# Patient Record
Sex: Male | Born: 1935 | Race: White | Hispanic: No | Marital: Married | Smoking: Former smoker
Health system: Southern US, Community
[De-identification: ages and names within clinical notes are randomized; demographics above are authoritative.]

## PROBLEM LIST (undated history)

## (undated) DIAGNOSIS — N189 Chronic kidney disease, unspecified: Secondary | ICD-10-CM

## (undated) DIAGNOSIS — I5032 Chronic diastolic (congestive) heart failure: Secondary | ICD-10-CM

## (undated) DIAGNOSIS — E119 Type 2 diabetes mellitus without complications: Secondary | ICD-10-CM

## (undated) DIAGNOSIS — G4733 Obstructive sleep apnea (adult) (pediatric): Secondary | ICD-10-CM

## (undated) DIAGNOSIS — I10 Essential (primary) hypertension: Secondary | ICD-10-CM

## (undated) DIAGNOSIS — J449 Chronic obstructive pulmonary disease, unspecified: Secondary | ICD-10-CM

## (undated) DIAGNOSIS — E785 Hyperlipidemia, unspecified: Secondary | ICD-10-CM

## (undated) HISTORY — DX: Obstructive sleep apnea (adult) (pediatric): G47.33

## (undated) HISTORY — DX: Type 2 diabetes mellitus without complications: E11.9

## (undated) HISTORY — DX: Chronic obstructive pulmonary disease, unspecified: J44.9

## (undated) HISTORY — DX: Chronic kidney disease, unspecified: N18.9

## (undated) HISTORY — PX: HERNIA REPAIR: SHX51

## (undated) HISTORY — DX: Chronic diastolic (congestive) heart failure: I50.32

## (undated) HISTORY — DX: Essential (primary) hypertension: I10

## (undated) HISTORY — PX: APPENDECTOMY: SHX54

## (undated) HISTORY — PX: BACK SURGERY: SHX140

## (undated) HISTORY — PX: KIDNEY STONE SURGERY: SHX686

## (undated) HISTORY — DX: Hyperlipidemia, unspecified: E78.5

---

## 2008-03-11 ENCOUNTER — Ambulatory Visit: Admission: RE | Admit: 2008-03-11 | Discharge: 2008-03-11 | Payer: Self-pay | Admitting: Internal Medicine

## 2008-06-25 ENCOUNTER — Encounter: Payer: Self-pay | Admitting: Cardiology

## 2008-11-04 ENCOUNTER — Ambulatory Visit (HOSPITAL_COMMUNITY): Admission: RE | Admit: 2008-11-04 | Discharge: 2008-11-05 | Payer: Self-pay | Admitting: Neurosurgery

## 2010-01-20 ENCOUNTER — Encounter: Payer: Self-pay | Admitting: Cardiology

## 2011-01-12 ENCOUNTER — Encounter: Payer: Self-pay | Admitting: Cardiology

## 2011-01-13 ENCOUNTER — Encounter: Payer: Self-pay | Admitting: Physician Assistant

## 2011-01-13 ENCOUNTER — Encounter: Payer: Self-pay | Admitting: Cardiology

## 2011-01-13 DIAGNOSIS — R079 Chest pain, unspecified: Secondary | ICD-10-CM

## 2011-02-16 ENCOUNTER — Encounter: Payer: Medicare Other | Admitting: Cardiology

## 2011-02-16 DIAGNOSIS — E785 Hyperlipidemia, unspecified: Secondary | ICD-10-CM | POA: Insufficient documentation

## 2011-02-16 DIAGNOSIS — G8929 Other chronic pain: Secondary | ICD-10-CM | POA: Insufficient documentation

## 2011-02-16 DIAGNOSIS — F17200 Nicotine dependence, unspecified, uncomplicated: Secondary | ICD-10-CM | POA: Insufficient documentation

## 2011-02-16 DIAGNOSIS — E1149 Type 2 diabetes mellitus with other diabetic neurological complication: Secondary | ICD-10-CM | POA: Insufficient documentation

## 2011-02-16 DIAGNOSIS — R079 Chest pain, unspecified: Secondary | ICD-10-CM | POA: Insufficient documentation

## 2011-02-16 DIAGNOSIS — E7800 Pure hypercholesterolemia, unspecified: Secondary | ICD-10-CM | POA: Insufficient documentation

## 2011-02-16 DIAGNOSIS — I739 Peripheral vascular disease, unspecified: Secondary | ICD-10-CM | POA: Insufficient documentation

## 2011-02-16 DIAGNOSIS — G479 Sleep disorder, unspecified: Secondary | ICD-10-CM | POA: Insufficient documentation

## 2011-02-16 DIAGNOSIS — E875 Hyperkalemia: Secondary | ICD-10-CM | POA: Insufficient documentation

## 2011-02-23 NOTE — Medication Information (Signed)
Summary: [facility] D/C MEDICATION SHEET ORDER  [facility] D/C MEDICATION SHEET ORDER   Imported By: Zachary George 02/16/2011 10:27:23  _____________________________________________________________________  External Attachment:    Type:   Image     Comment:   External Document

## 2011-02-23 NOTE — Letter (Signed)
Summary: [facility] D/C DR. [facility]  [facility] D[facility]C DR. [facility]   Imported By: Zachary George 02/16/2011 10:26:33  _____________________________________________________________________  External Attachment:    Type:   Image     Comment:   External Document

## 2011-02-23 NOTE — Consult Note (Signed)
Summary: CARDIOLOGY CONSULT/ [facility]  CARDIOLOGY CONSULT/ [facility]   Imported By: Zachary George 02/16/2011 10:26:56  _____________________________________________________________________  External Attachment:    Type:   Image     Comment:   External Document

## 2011-04-27 NOTE — Procedures (Signed)
NAME:  Douglas [name], Douglas [name]                ACCOUNT NO.:  [id number]   MEDICAL RECORD NO.:  [id number]          PATIENT TYPE:  OUT   LOCATION:  DFTL                          FACILITY:  [facility]   PHYSICIAN:  Kofi A. Gerilyn Pilgrim, M.D. DATE OF BIRTH:  Jan 03, 1936   DATE OF PROCEDURE:  03/11/2008  DATE OF DISCHARGE:                             SLEEP DISORDER REPORT   POLYSOMNOGRAPHY REPORT   REFERRING PHYSICIAN:  Kirstie Peri.   RECORDING DATE:  03/11/3008   INDICATIONS:  A 75 year old man who presents with snoring and  hypersomnia.  He is being evaluated for obstructive sleep apnea  syndrome.  BMI 28.   MEDICATION:  1. Doxazosin.  2. HydroDIURIL.  3. Lisinopril.  4. Lovastatin.  5. Metformin.  6. Glyburide.  7. Aspirin.  8. Insulin.   Epworth sleepiness scale 16.   SLEEP STAGE SUMMARY:  Total recording time of 332 minutes.  Sleep  efficiency 63 minutes.  Sleep latency 18 minutes.  REM latency 224  minutes.  Stage N1 at 35% and 2 at 57% and 3 at 0% and REM stage 7.9%.   RESPIRATORY SUMMARY:  Baseline oxygen saturation 97%.  Lowest saturation  82%.  RDI 79.  The AHI is also 79.   LEG MOVEMENT SUMMARY:  No nocturnal leg movements observed.   ELECTROCARDIOGRAM STUDY:  Average heart rate 75 with occasional PACs  observed.   IMPRESSION:  Severe obstructive sleep apnea syndrome.   RECOMMENDATIONS:  Formal titration study.   Thanks for this referral.      Kofi A. Gerilyn Pilgrim, M.D.  Electronically Signed     KAD/MEDQ  D:  03/12/2008  T:  03/12/2008  Job:  [id number]

## 2011-04-27 NOTE — Op Note (Signed)
NAME:  Douglas [name], Douglas [name]                ACCOUNT NO.:  [id number]   MEDICAL RECORD NO.:  [id number]          PATIENT TYPE:  OIB   LOCATION:  3534                         FACILITY:  [facility]   PHYSICIAN:  Kathaleen Maser. Pool, M.D.    DATE OF BIRTH:  09-Dec-1936   DATE OF PROCEDURE:  11/04/2008  DATE OF DISCHARGE:                               OPERATIVE REPORT   PREOPERATIVE DIAGNOSIS:  Right L3-4 herniated nucleus pulposus with  radiculopathy.   POSTOPERATIVE DIAGNOSIS:  Right L3-4 herniated nucleus pulposus with  radiculopathy.   PROCEDURE NOTE:  Right L3-4 laminotomy and microdiskectomy.   SURGEON:  Kathaleen Maser. Pool, MD   ASSISTANT:  Donalee Citrin, MD   ANESTHESIA:  General endotracheal.   INDICATION:  [name] is a 75 year old male with history of back and  right lower extremity pain with a right-sided L4 radiculopathy.  Workup  demonstrates evidence of a large right-sided L3-4 disk herniation with  compression of the thecal sac in right-sided L4 nerve root.  The patient  was then counseled as to options.  He decided to proceed with a right-  sided L3-4 laminotomy and microdiskectomy in hopes of improving his  symptoms.   OPERATION:  The patient was taken to the operating room and placed on  the operating table in a supine position.  After adequate level of  anesthesia was achieved, the patient was positioned prone onto Wilson  frame and appropriately padded.  The patient's lumbar region was prepped  and draped sterilely.  A 10-blade was used to make a curvilinear skin  incision overlying the L3-4 interspace.  This was carried down sharply  in the midline.  A subperiosteal dissection was performed exposing the  lamina and facet joints of L3 and L4 on the right side.  Deep self-  retaining retractor was placed.  Intraoperative x-ray was taken and the  level was confirmed.  Laminotomy was then performed using high-speed  drill and Kerrison rongeurs to the inferior aspect of the lamina of L3,  medial aspect of the L3 and L4 facet joint, and superior rim of L4  level.  The ligamentum flavum was then elevated and resected in a  piecemeal fashion.  Using Kerrison rongeurs, we entered the thecal sac  and exiting L4 nerve root were identified.  Microscope was then brought  into field and used for microdissection on the right side L4 nerve root  and underlying disk herniation.  Epidural venous plexus was coagulated  and cut.  Thecal sac and nerve root were gently mobilized.  A large disk  herniation was then encountered.  This was incised with a 15 blade in a  rectangular fashion.  Wide disk space clean-out was then achieved using  pituitary rongeurs, up-biting pituitary rongeurs and Epstein curettes.  All elements of the disk herniation was resected.  All loose or  obviously degenerative disk material was then removed from the  interspace.  At this point, a very thorough diskectomy then achieved.  There was no injury to thecal sac or nerve roots.  The wound was then  irrigated with antibiotic solution.  Gelfoam was placed topically for  hemostasis  which was found to be good.  The microscope and retractors were removed.  Hemostasis was achieved with electrocautery.  The wounds were closed in  layers with Vicryl sutures.  Steri-Strips and sterile dressing were  applied.  The patient tolerated the procedure well and he returned to  the recovery room postoperatively.           ______________________________  Kathaleen Maser Pool, M.D.     HAP/MEDQ  D:  11/04/2008  T:  11/05/2008  Job:  [id number]

## 2011-07-15 DIAGNOSIS — R0602 Shortness of breath: Secondary | ICD-10-CM

## 2011-07-15 DIAGNOSIS — J810 Acute pulmonary edema: Secondary | ICD-10-CM

## 2011-09-15 LAB — CBC
HCT: 36.6 — ABNORMAL LOW
Hemoglobin: 12.2 — ABNORMAL LOW
MCV: 94.9
RBC: 3.86 — ABNORMAL LOW
RDW: 14
WBC: 4.7

## 2011-09-15 LAB — GLUCOSE, CAPILLARY
Glucose-Capillary: 138 — ABNORMAL HIGH
Glucose-Capillary: 177 — ABNORMAL HIGH
Glucose-Capillary: 374 — ABNORMAL HIGH
Glucose-Capillary: 450 — ABNORMAL HIGH
Glucose-Capillary: 79

## 2011-09-15 LAB — DIFFERENTIAL
Basophils Relative: 1
Eosinophils Absolute: 0.1
Eosinophils Relative: 2
Monocytes Absolute: 0.6
Neutro Abs: 2.9

## 2011-09-15 LAB — BASIC METABOLIC PANEL
GFR calc Af Amer: 58 — ABNORMAL LOW
Glucose, Bld: 72
Sodium: 137

## 2011-09-15 LAB — TYPE AND SCREEN: Antibody Screen: NEGATIVE

## 2011-09-15 LAB — ABO/RH: ABO/RH(D): A NEG

## 2011-10-04 DIAGNOSIS — R072 Precordial pain: Secondary | ICD-10-CM

## 2011-10-04 DIAGNOSIS — R0602 Shortness of breath: Secondary | ICD-10-CM

## 2011-10-05 ENCOUNTER — Inpatient Hospital Stay (HOSPITAL_COMMUNITY)
Admission: AD | Admit: 2011-10-05 | Discharge: 2011-10-06 | DRG: 287 | Disposition: A | Discharge status: Home / Self Care | Payer: Medicare Other | Source: Other Acute Inpatient Hospital | Attending: Cardiovascular Disease | Admitting: Cardiovascular Disease

## 2011-10-05 DIAGNOSIS — I2510 Atherosclerotic heart disease of native coronary artery without angina pectoris: Secondary | ICD-10-CM

## 2011-10-05 DIAGNOSIS — N183 Chronic kidney disease, stage 3 unspecified: Secondary | ICD-10-CM | POA: Diagnosis present

## 2011-10-05 DIAGNOSIS — E119 Type 2 diabetes mellitus without complications: Secondary | ICD-10-CM | POA: Diagnosis present

## 2011-10-05 DIAGNOSIS — J4489 Other specified chronic obstructive pulmonary disease: Secondary | ICD-10-CM | POA: Diagnosis present

## 2011-10-05 DIAGNOSIS — R0602 Shortness of breath: Secondary | ICD-10-CM | POA: Diagnosis present

## 2011-10-05 DIAGNOSIS — D649 Anemia, unspecified: Secondary | ICD-10-CM | POA: Diagnosis present

## 2011-10-05 DIAGNOSIS — I739 Peripheral vascular disease, unspecified: Secondary | ICD-10-CM | POA: Diagnosis present

## 2011-10-05 DIAGNOSIS — J449 Chronic obstructive pulmonary disease, unspecified: Secondary | ICD-10-CM | POA: Diagnosis present

## 2011-10-05 DIAGNOSIS — R0789 Other chest pain: Principal | ICD-10-CM | POA: Diagnosis present

## 2011-10-05 DIAGNOSIS — I129 Hypertensive chronic kidney disease with stage 1 through stage 4 chronic kidney disease, or unspecified chronic kidney disease: Secondary | ICD-10-CM | POA: Diagnosis present

## 2011-10-05 DIAGNOSIS — Z23 Encounter for immunization: Secondary | ICD-10-CM

## 2011-10-05 HISTORY — PX: CARDIAC CATHETERIZATION: SHX172

## 2011-10-05 LAB — BASIC METABOLIC PANEL
BUN: 29 mg/dL — ABNORMAL HIGH (ref 6–23)
CO2: 26 mEq/L (ref 19–32)
GFR calc Af Amer: 44 mL/min — ABNORMAL LOW (ref >90)
Glucose, Bld: 208 mg/dL — ABNORMAL HIGH (ref 70–99)

## 2011-10-05 LAB — CBC
Hemoglobin: 12.1 g/dL — ABNORMAL LOW (ref 13.0–17.0)
MCV: 88.4 fL (ref 78.0–100.0)
RDW: 13.2 % (ref 11.5–15.5)
WBC: 6.2 10*3/uL (ref 4.0–10.5)

## 2011-10-05 LAB — GLUCOSE, CAPILLARY: Glucose-Capillary: 159 mg/dL — ABNORMAL HIGH (ref 70–99)

## 2011-10-05 LAB — POCT I-STAT 3, ART BLOOD GAS (G3+)
Bicarbonate: 25.6 mEq/L — ABNORMAL HIGH (ref 20.0–24.0)
pCO2 arterial: 45.2 mmHg — ABNORMAL HIGH (ref 35.0–45.0)

## 2011-10-05 LAB — POCT I-STAT 3, VENOUS BLOOD GAS (G3P V)
pH, Ven: 7.341 — ABNORMAL HIGH (ref 7.250–7.300)
pO2, Ven: 38 mmHg (ref 30.0–45.0)

## 2011-10-06 DIAGNOSIS — R079 Chest pain, unspecified: Secondary | ICD-10-CM

## 2011-10-06 LAB — BASIC METABOLIC PANEL
CO2: 24 mEq/L (ref 19–32)
Calcium: 8.9 mg/dL (ref 8.4–10.5)
Chloride: 107 mEq/L (ref 96–112)
Creatinine, Ser: 1.73 mg/dL — ABNORMAL HIGH (ref 0.50–1.35)
Glucose, Bld: 129 mg/dL — ABNORMAL HIGH (ref 70–99)
Sodium: 139 mEq/L (ref 135–145)

## 2011-10-06 LAB — GLUCOSE, CAPILLARY: Glucose-Capillary: 249 mg/dL — ABNORMAL HIGH (ref 70–99)

## 2011-10-06 LAB — CBC
Hemoglobin: 10.6 g/dL — ABNORMAL LOW (ref 13.0–17.0)
MCH: 30.1 pg (ref 26.0–34.0)
MCV: 88.6 fL (ref 78.0–100.0)
RBC: 3.52 MIL/uL — ABNORMAL LOW (ref 4.22–5.81)

## 2011-10-07 NOTE — Cardiovascular Report (Signed)
NAME:  Douglas [name], Douglas [name] NO.:  [id number]  MEDICAL RECORD NO.:  [id number]  LOCATION:  4735                         [facility]ACILITY[facility]  [facility]  PHYSICIAN:  Verne Carrow, MDDATE OF BIRTH:  August 09, 1936  DATE OF PROCEDURE:  10/05/2011 DATE OF DISCHARGE:                           CARDIAC CATHETERIZATION   PRIMARY CARDIOLOGIST:  Learta Codding, MD, [facility]  PRIMARY CARE PHYSICIAN:  Kirstie Peri, MD  PROCEDURE PERFORMED: 1. Left heart catheterization. 2. Right heart catheterization. 3. Selective coronary angiography.  OPERATOR: Verne Carrow, MD.  INDICATION:  This is a 75 year old Caucasian male with a past medical history significant for diabetes mellitus, hypertension, known peripheral vascular disease, and COPD who was admitted to  [facility] with complaints of chest pain and shortness of breath.  The patient was evaluated by the [facility] Cardiology Consult Team at [facility].  His cardiac enzymes were negative.  Because of his cardiac risk factors, he was transferred to [facility] for a diagnostic right and left heart catheterization.  DETAILS OF PROCEDURE:  The patient was brought to the main cardiac catheterization laboratory after signing informed consent for the procedure.  The right groin was prepped and draped in sterile fashion. Lidocaine 1% was used for local anesthesia.  A 5-French sheath was inserted into the right femoral artery without difficulty.  A 6-French sheath was inserted into the right femoral vein without difficulty.  A multipurpose catheter was used to perform a right heart catheterization. Standard diagnostic catheters were used to perform selective coronary angiography.  A pigtail catheter was used to cross the aortic valve into the left ventricle.  Left ventricular pressure measurements were performed, however, no left ventricular angiogram was performed secondary to the patient's renal insufficiency.  The  patient tolerated the procedure well.  There were no immediate complications.  The patient was taken to the recovery area in stable condition.  HEMODYNAMIC FINDINGS:  Central aortic pressure 146/62, left ventricular pressure 153/9/15.  Right atrial pressure 9, right ventricular pressure 40/8/12, pulmonary artery pressure 42/13 with a mean of 27, pulmonary capillary wedge pressure mean 10, central aortic saturation 96%, pulmonary artery saturation 68%.  Cardiac output by the Fick method 6.47 L/min.  Cardiac index 2.89 L/min/m2.  ANGIOGRAPHIC FINDINGS: 1. The left main coronary artery had no evidence of disease. 2. The left anterior descending is a large vessel that coursed to the     apex and gives off a moderate-sized first diagonal branch and a     small-to-moderate size second diagonal branch.  There is 20% plaque     in the proximal and mid LAD.  There is mild plaque in both diagonal     branches.  None of these lesions are flow limiting. 3. Circumflex artery gives off a small caliber first obtuse marginal     branch that has mild plaque disease.  The proximal circumflex has a     20% stenosis.  Second obtuse marginal branch is moderate size and     has mild plaque disease.  The AV groove circumflex beyond the     takeoff of the second marginal branch becomes small in caliber and     has plaque  disease only. 4. The right coronary artery is a large dominant vessel with 10%     proximal plaque, 30% mid plaque, and 10% plaque in the distal     vessel. 5. No left ventricular angiogram was performed. 6. A 65 mL of contrast dye was used.  IMPRESSION: 1. Mild nonobstructive coronary artery disease. 2. Normal filling pressures. 3. Noncardiac chest pain. 4. Dyspnea of unknown etiology, most likely secondary to his     underlying lung disease.  RECOMMENDATIONS:  At this time, I would recommend medical management for the patient's coronary artery disease.  We will continue his  aspirin. We will continue his beta-blocker.  We will start a statin medication. We will hold his Lasix for now, and we will follow his renal function closely following the cardiac catheterization.  Lasix to be resumed at a later date.  We will hydrate him tonight and check another creatinine in the morning.     Verne Carrow, MD     CM/MEDQ  D:  10/05/2011  T:  10/06/2011  Job:  [id number]  cc:   Learta Codding, MD,FACC Kirstie Peri, MD  Electronically Signed by Verne Carrow MD on 10/07/2011 01:52:21 PM

## 2011-10-14 NOTE — Discharge Summary (Signed)
NAMEMARKIS, [name] NO.:  [id number]  MEDICAL RECORD NO.:  [id number]  LOCATION:  4735                         FACILITY:  [facility]  PHYSICIAN:  Douglas Bears, MD     DATE OF BIRTH:  07/31/1936  DATE OF ADMISSION:  10/05/2011 DATE OF DISCHARGE:  10/06/2011                              DISCHARGE SUMMARY   PROCEDURES: 1. Right heart catheterization. 2. Left heart catheterization 3. Coronary arteriogram.  PRIMARY FINAL DISCHARGE DIAGNOSES: 1. Chest pain. 2. Shortness of breath.  SECONDARY DIAGNOSES: 1. Diabetes. 2. History of preserved left ventricular function by echocardiogram in     August 2012. 3. Mild anemia with a hemoglobin of 10.6 and hematocrit of 31.2 at     discharge. 4. Chronic kidney disease, stage III with a BUN of 33, creatinine     1.73, and GFR 37 at discharge. 5. History of recent bilateral pneumonia with loculated left pleural     effusion, chronic. 6. Hypertension. 7. Cerebrovascular disease. 8. Asymptomatic sinus bradycardia, heart rate briefly in the 30s.  TIME AT DISCHARGE:  38 minutes.  HOSPITAL COURSE:  Douglas [name] is a 75 year old male with no previous history of coronary artery disease, although he has been evaluated for chest pain in the past.  He went to [facility] with chest pain. There was concern for coronary artery disease with multiple cardiac risk factors.  He was also short of breath.  Right and left heart cath was recommended and he came to the hospital for the procedure on October 05, 2011.  Douglas [name] had normal filling pressures with a wedge of 10 and a PAS of 42.  In the coronary arteriogram, the LAD had a 20% proximal stenosis and the RCA had a 30% mid stenosis with no other disease noted.  Dr. Kirke Corin recommended medical management with aspirin and statin.  His Lasix had been held because of renal insufficiency, but it was felt that he could resume his normal Lasix dose in a.m.  On October 06, 2011,  Douglas [name] was ambulating without chest pain or shortness of breath, and his BUN and creatinine were stable.  He is considered stable for discharge, to follow up as an outpatient.  DISCHARGE INSTRUCTIONS: 1. His activity level to be increased gradually with no driving for 2     days and no lifting for a week.  He is to call our office for     problems with the cath site.  He is encouraged to stick to a low-     sodium diabetic diet.  He is to follow up with Dr. Andee Lineman and a     message has been left with the office, so they will call him.  He     is to follow up with Dr. Sherryll Burger as needed.  DISCHARGE MEDICATIONS: 1. Celexa 20 mg daily. 2. Spiriva daily. 3. Toprol-XL is discontinued secondary to bradycardia. 4. Norvasc 5 mg a day. 5. Hydralazine 25 mg b.i.d. 6. Crestor 10 mg at bedtime. 7. Novolin NPH 20-35 units b.i.d. as prior to admission. 8. Novolin R 5 units daily before dinner. 9. Lasix 20 mg a day. 10.Aspirin 81 mg a day. 11.Advair  Diskus b.i.d. 12.Flomax 0.4 mg daily as prior to admission.     Theodore Demark, PA-C   ______________________________ Douglas Bears, MD    RB/MEDQ  D:  10/06/2011  T:  10/06/2011  Job:  [id number]  cc:   Kirstie Peri, MD  Electronically Signed by Theodore Demark PA-C on 10/14/2011 04:01:40 PM Electronically Signed by Douglas Bears MD on 10/14/2011 09:31:27 PM

## 2011-10-19 ENCOUNTER — Encounter: Payer: Self-pay | Admitting: *Deleted

## 2011-10-21 ENCOUNTER — Ambulatory Visit (INDEPENDENT_AMBULATORY_CARE_PROVIDER_SITE_OTHER): Payer: Medicare Other | Admitting: Cardiology

## 2011-10-21 ENCOUNTER — Encounter: Payer: Self-pay | Admitting: Cardiology

## 2011-10-21 VITALS — BP 156/68 | HR 63 | Ht 72.0 in | Wt 231.0 lb

## 2011-10-21 DIAGNOSIS — R0609 Other forms of dyspnea: Secondary | ICD-10-CM

## 2011-10-21 DIAGNOSIS — R0989 Other specified symptoms and signs involving the circulatory and respiratory systems: Secondary | ICD-10-CM | POA: Insufficient documentation

## 2011-10-21 DIAGNOSIS — G4733 Obstructive sleep apnea (adult) (pediatric): Secondary | ICD-10-CM

## 2011-10-21 DIAGNOSIS — R0600 Dyspnea, unspecified: Secondary | ICD-10-CM | POA: Insufficient documentation

## 2011-10-21 DIAGNOSIS — R079 Chest pain, unspecified: Secondary | ICD-10-CM

## 2011-10-21 NOTE — Assessment & Plan Note (Signed)
No recurrent chest pain. Continue medical therapy with aspirin and statin drug therapy and risk factor modification. Patient has stopped smoking.

## 2011-10-21 NOTE — Assessment & Plan Note (Addendum)
Right carotid bruit will obtain carotid Dopplers in one year.

## 2011-10-21 NOTE — Patient Instructions (Signed)
Your physician wants you to follow-up in: 6 months. You will receive a reminder letter in the mail one-two months in advance. If you don't receive a letter, please call our office to schedule the follow-up appointment. Your physician recommends that you continue on your current medications as directed. Please refer to the Current Medication list given to you today. Your physician has requested that you have a carotid duplex. This test is an ultrasound of the carotid arteries in your neck. It looks at blood flow through these arteries that supply the brain with blood. Allow one hour for this exam. There are no restrictions or special instructions.  If the results of your test are normal or stable, you will receive a letter. If they are abnormal, the nurse will contact you by phone. 

## 2011-10-21 NOTE — Assessment & Plan Note (Signed)
Possibly diastolic heart failure and volume overload in the setting of obstructive sleep apnea. However I cannot exclude that his lower extremity edema secondary to amlodipine, although we don't know for sure he is taking the medication. Is currently taking Lasix 40 mg daily for his primary care physician

## 2011-10-21 NOTE — Assessment & Plan Note (Signed)
Likely secondary to some underlying lung disease. The patient to followup his primary care physician. PFTs should be done if not already ordered.

## 2011-11-03 ENCOUNTER — Encounter (INDEPENDENT_AMBULATORY_CARE_PROVIDER_SITE_OTHER): Payer: Medicare Other | Admitting: *Deleted

## 2011-11-03 DIAGNOSIS — R0989 Other specified symptoms and signs involving the circulatory and respiratory systems: Secondary | ICD-10-CM

## 2011-11-07 NOTE — Progress Notes (Signed)
CC: follow- up after cath.   HPI:  Doing well . No recurrent SCP. Recent cath. Mild non- obstructive CAd with normal filling pressures. No complications post cath. No SOB . S/p edema LE but norvasc d/c'd. CDHF on  Lasix      PMH: reviewed and listed in Problem List in Electronic Records (and see below)  Allergies/SH/FHX : available in Electronic Records for review  Medications: Current Outpatient Prescriptions  Medication Sig Dispense Refill  . amLODipine (NORVASC) 5 MG tablet Take 5 mg by mouth daily.        Marland Kitchen aspirin EC 81 MG tablet Take 81 mg by mouth daily.        . citalopram (CELEXA) 20 MG tablet Take 20 mg by mouth daily.        . Fluticasone-Salmeterol (ADVAIR DISKUS) 250-50 MCG/DOSE AEPB Inhale 1 puff into the lungs every 12 (twelve) hours.        . furosemide (LASIX) 20 MG tablet Take 20 mg by mouth daily.        . hydrALAZINE (APRESOLINE) 25 MG tablet Take 25 mg by mouth 2 (two) times daily.        . insulin NPH (HUMULIN N,NOVOLIN N) 100 UNIT/ML injection Inject into the skin as directed.        . insulin regular (HUMULIN R,NOVOLIN R) 100 units/mL injection Inject into the skin as directed.        . rosuvastatin (CRESTOR) 10 MG tablet Take 10 mg by mouth daily.        . Tamsulosin HCl (FLOMAX) 0.4 MG CAPS Take 0.4 mg by mouth daily.        Marland Kitchen tiotropium (SPIRIVA) 18 MCG inhalation capsule Place 18 mcg into inhaler and inhale daily.          ROS: No nausea or vomiting. No fever or chills.No melena or hematochezia.No bleeding.No claudication  Physical Exam: BP 156/68  Pulse 63  Ht 6' (1.829 m)  Wt 231 lb (104.781 kg)  BMI 31.33 kg/m2  SpO2 95% General:well-n ourished.  Neck:no swelling. Normal carotid upstroke. Carotid bruitR.  Lungs:clear BS. B  Cardiac:RRR. Normal S1/s2 Vascular:No edema. Normal pulses.  Skin:warm and dry.   12lead ECG:NA  Limited bedside ECHO:N/A   Assessment and Plan

## 2013-05-14 ENCOUNTER — Encounter (INDEPENDENT_AMBULATORY_CARE_PROVIDER_SITE_OTHER): Payer: Self-pay | Admitting: *Deleted

## 2013-05-14 ENCOUNTER — Encounter (INDEPENDENT_AMBULATORY_CARE_PROVIDER_SITE_OTHER): Payer: Self-pay

## 2014-03-16 ENCOUNTER — Observation Stay (HOSPITAL_COMMUNITY): Payer: Medicare Other

## 2014-03-16 ENCOUNTER — Inpatient Hospital Stay (HOSPITAL_COMMUNITY)
Admission: EM | Admit: 2014-03-16 | Discharge: 2014-03-20 | DRG: 194 | Disposition: A | Discharge status: Home / Self Care | Payer: Medicare Other | Attending: Internal Medicine | Admitting: Internal Medicine

## 2014-03-16 ENCOUNTER — Encounter (HOSPITAL_COMMUNITY): Payer: Self-pay | Admitting: Internal Medicine

## 2014-03-16 ENCOUNTER — Emergency Department (HOSPITAL_COMMUNITY): Payer: Medicare Other

## 2014-03-16 DIAGNOSIS — IMO0001 Reserved for inherently not codable concepts without codable children: Secondary | ICD-10-CM | POA: Diagnosis present

## 2014-03-16 DIAGNOSIS — D509 Iron deficiency anemia, unspecified: Secondary | ICD-10-CM | POA: Diagnosis present

## 2014-03-16 DIAGNOSIS — R0989 Other specified symptoms and signs involving the circulatory and respiratory systems: Secondary | ICD-10-CM

## 2014-03-16 DIAGNOSIS — R079 Chest pain, unspecified: Secondary | ICD-10-CM

## 2014-03-16 DIAGNOSIS — F339 Major depressive disorder, recurrent, unspecified: Secondary | ICD-10-CM | POA: Diagnosis present

## 2014-03-16 DIAGNOSIS — E785 Hyperlipidemia, unspecified: Secondary | ICD-10-CM | POA: Diagnosis present

## 2014-03-16 DIAGNOSIS — Z794 Long term (current) use of insulin: Secondary | ICD-10-CM

## 2014-03-16 DIAGNOSIS — N183 Chronic kidney disease, stage 3 unspecified: Secondary | ICD-10-CM | POA: Diagnosis present

## 2014-03-16 DIAGNOSIS — Z87891 Personal history of nicotine dependence: Secondary | ICD-10-CM

## 2014-03-16 DIAGNOSIS — I509 Heart failure, unspecified: Secondary | ICD-10-CM | POA: Diagnosis present

## 2014-03-16 DIAGNOSIS — F17200 Nicotine dependence, unspecified, uncomplicated: Secondary | ICD-10-CM

## 2014-03-16 DIAGNOSIS — E46 Unspecified protein-calorie malnutrition: Secondary | ICD-10-CM | POA: Diagnosis present

## 2014-03-16 DIAGNOSIS — J4489 Other specified chronic obstructive pulmonary disease: Secondary | ICD-10-CM | POA: Diagnosis present

## 2014-03-16 DIAGNOSIS — R0600 Dyspnea, unspecified: Secondary | ICD-10-CM

## 2014-03-16 DIAGNOSIS — R042 Hemoptysis: Secondary | ICD-10-CM

## 2014-03-16 DIAGNOSIS — Z800 Family history of malignant neoplasm of digestive organs: Secondary | ICD-10-CM

## 2014-03-16 DIAGNOSIS — R7989 Other specified abnormal findings of blood chemistry: Secondary | ICD-10-CM | POA: Diagnosis present

## 2014-03-16 DIAGNOSIS — J90 Pleural effusion, not elsewhere classified: Secondary | ICD-10-CM | POA: Diagnosis present

## 2014-03-16 DIAGNOSIS — G479 Sleep disorder, unspecified: Secondary | ICD-10-CM

## 2014-03-16 DIAGNOSIS — F331 Major depressive disorder, recurrent, moderate: Secondary | ICD-10-CM

## 2014-03-16 DIAGNOSIS — E7800 Pure hypercholesterolemia, unspecified: Secondary | ICD-10-CM

## 2014-03-16 DIAGNOSIS — G8929 Other chronic pain: Secondary | ICD-10-CM

## 2014-03-16 DIAGNOSIS — J449 Chronic obstructive pulmonary disease, unspecified: Secondary | ICD-10-CM | POA: Diagnosis present

## 2014-03-16 DIAGNOSIS — I440 Atrioventricular block, first degree: Secondary | ICD-10-CM | POA: Diagnosis present

## 2014-03-16 DIAGNOSIS — E876 Hypokalemia: Secondary | ICD-10-CM | POA: Diagnosis present

## 2014-03-16 DIAGNOSIS — J189 Pneumonia, unspecified organism: Principal | ICD-10-CM | POA: Diagnosis present

## 2014-03-16 DIAGNOSIS — E1149 Type 2 diabetes mellitus with other diabetic neurological complication: Secondary | ICD-10-CM

## 2014-03-16 DIAGNOSIS — Z7982 Long term (current) use of aspirin: Secondary | ICD-10-CM

## 2014-03-16 DIAGNOSIS — Z888 Allergy status to other drugs, medicaments and biological substances status: Secondary | ICD-10-CM

## 2014-03-16 DIAGNOSIS — I5032 Chronic diastolic (congestive) heart failure: Secondary | ICD-10-CM | POA: Diagnosis present

## 2014-03-16 DIAGNOSIS — E1165 Type 2 diabetes mellitus with hyperglycemia: Secondary | ICD-10-CM

## 2014-03-16 DIAGNOSIS — I739 Peripheral vascular disease, unspecified: Secondary | ICD-10-CM

## 2014-03-16 DIAGNOSIS — F329 Major depressive disorder, single episode, unspecified: Secondary | ICD-10-CM | POA: Diagnosis present

## 2014-03-16 DIAGNOSIS — G4700 Insomnia, unspecified: Secondary | ICD-10-CM | POA: Diagnosis present

## 2014-03-16 DIAGNOSIS — D649 Anemia, unspecified: Secondary | ICD-10-CM | POA: Diagnosis present

## 2014-03-16 DIAGNOSIS — E875 Hyperkalemia: Secondary | ICD-10-CM

## 2014-03-16 DIAGNOSIS — G4733 Obstructive sleep apnea (adult) (pediatric): Secondary | ICD-10-CM

## 2014-03-16 DIAGNOSIS — E871 Hypo-osmolality and hyponatremia: Secondary | ICD-10-CM | POA: Diagnosis present

## 2014-03-16 LAB — URINALYSIS, ROUTINE W REFLEX MICROSCOPIC
Bilirubin Urine: NEGATIVE
Glucose, UA: 250 mg/dL — AB
HGB URINE DIPSTICK: NEGATIVE
KETONES UR: NEGATIVE mg/dL
Leukocytes, UA: NEGATIVE
Nitrite: NEGATIVE
PROTEIN: 100 mg/dL — AB
Specific Gravity, Urine: 1.013 (ref 1.005–1.030)
Urobilinogen, UA: 1 mg/dL (ref 0.0–1.0)
pH: 5.5 (ref 5.0–8.0)

## 2014-03-16 LAB — COMPREHENSIVE METABOLIC PANEL
ALT: 33 U/L (ref 0–53)
AST: 22 U/L (ref 0–37)
Albumin: 2.5 g/dL — ABNORMAL LOW (ref 3.5–5.2)
Alkaline Phosphatase: 98 U/L (ref 39–117)
BILIRUBIN TOTAL: 0.8 mg/dL (ref 0.3–1.2)
BUN: 50 mg/dL — AB (ref 6–23)
CHLORIDE: 95 meq/L — AB (ref 96–112)
CO2: 26 meq/L (ref 19–32)
CREATININE: 2.07 mg/dL — AB (ref 0.50–1.35)
Calcium: 7.9 mg/dL — ABNORMAL LOW (ref 8.4–10.5)
GFR, EST AFRICAN AMERICAN: 34 mL/min — AB (ref >90)
GFR, EST NON AFRICAN AMERICAN: 30 mL/min — AB (ref >90)
GLUCOSE: 105 mg/dL — AB (ref 70–99)
Potassium: 2.9 mEq/L — CL (ref 3.7–5.3)
Sodium: 135 mEq/L — ABNORMAL LOW (ref 137–147)
Total Protein: 6.4 g/dL (ref 6.0–8.3)

## 2014-03-16 LAB — URINE MICROSCOPIC-ADD ON

## 2014-03-16 LAB — I-STAT CG4 LACTIC ACID, ED: Lactic Acid, Venous: 1 mmol/L (ref 0.5–2.2)

## 2014-03-16 LAB — CBC WITH DIFFERENTIAL/PLATELET
Basophils Absolute: 0 10*3/uL (ref 0.0–0.1)
Basophils Relative: 0 % (ref 0–1)
Eosinophils Absolute: 0 10*3/uL (ref 0.0–0.7)
Eosinophils Relative: 0 % (ref 0–5)
HEMATOCRIT: 31.7 % — AB (ref 39.0–52.0)
HEMOGLOBIN: 10.9 g/dL — AB (ref 13.0–17.0)
LYMPHS PCT: 8 % — AB (ref 12–46)
Lymphs Abs: 0.5 10*3/uL — ABNORMAL LOW (ref 0.7–4.0)
MCH: 31.3 pg (ref 26.0–34.0)
MCHC: 34.4 g/dL (ref 30.0–36.0)
MCV: 91.1 fL (ref 78.0–100.0)
MONO ABS: 1.5 10*3/uL — AB (ref 0.1–1.0)
MONOS PCT: 22 % — AB (ref 3–12)
NEUTROS ABS: 4.7 10*3/uL (ref 1.7–7.7)
Neutrophils Relative %: 70 % (ref 43–77)
Platelets: 169 10*3/uL (ref 150–400)
RBC: 3.48 MIL/uL — ABNORMAL LOW (ref 4.22–5.81)
RDW: 14 % (ref 11.5–15.5)
WBC: 6.7 10*3/uL (ref 4.0–10.5)

## 2014-03-16 LAB — I-STAT TROPONIN, ED: TROPONIN I, POC: 0.01 ng/mL (ref 0.00–0.08)

## 2014-03-16 LAB — STREP PNEUMONIAE URINARY ANTIGEN: Strep Pneumo Urinary Antigen: NEGATIVE

## 2014-03-16 LAB — PRO B NATRIURETIC PEPTIDE: Pro B Natriuretic peptide (BNP): 3251 pg/mL — ABNORMAL HIGH (ref 0–450)

## 2014-03-16 LAB — CREATININE, URINE, RANDOM: Creatinine, Urine: 30.03 mg/dL

## 2014-03-16 LAB — CBG MONITORING, ED: GLUCOSE-CAPILLARY: 157 mg/dL — AB (ref 70–99)

## 2014-03-16 LAB — GLUCOSE, CAPILLARY: Glucose-Capillary: 96 mg/dL (ref 70–99)

## 2014-03-16 LAB — SODIUM, URINE, RANDOM: SODIUM UR: 90 meq/L

## 2014-03-16 LAB — MRSA PCR SCREENING: MRSA by PCR: NEGATIVE

## 2014-03-16 MED ORDER — ALBUTEROL SULFATE (2.5 MG/3ML) 0.083% IN NEBU
2.5000 mg | INHALATION_SOLUTION | RESPIRATORY_TRACT | Status: DC
  Administered 2014-03-16 – 2014-03-19 (×12): 2.5 mg via RESPIRATORY_TRACT
  Filled 2014-03-16 (×13): qty 3

## 2014-03-16 MED ORDER — PROPRANOLOL HCL 10 MG PO TABS
10.0000 mg | ORAL_TABLET | Freq: Two times a day (BID) | ORAL | Status: DC
  Administered 2014-03-16 – 2014-03-20 (×8): 10 mg via ORAL
  Filled 2014-03-16 (×9): qty 1

## 2014-03-16 MED ORDER — CITALOPRAM HYDROBROMIDE 40 MG PO TABS
40.0000 mg | ORAL_TABLET | Freq: Every day | ORAL | Status: DC
  Administered 2014-03-17: 40 mg via ORAL
  Filled 2014-03-16: qty 1

## 2014-03-16 MED ORDER — TRAZODONE HCL 50 MG PO TABS
50.0000 mg | ORAL_TABLET | Freq: Every day | ORAL | Status: DC
  Administered 2014-03-16: 50 mg via ORAL
  Filled 2014-03-16 (×2): qty 1

## 2014-03-16 MED ORDER — ALLOPURINOL 100 MG PO TABS
100.0000 mg | ORAL_TABLET | Freq: Every morning | ORAL | Status: DC
  Administered 2014-03-17 – 2014-03-20 (×4): 100 mg via ORAL
  Filled 2014-03-16 (×4): qty 1

## 2014-03-16 MED ORDER — INSULIN ASPART 100 UNIT/ML ~~LOC~~ SOLN
0.0000 [IU] | Freq: Every day | SUBCUTANEOUS | Status: DC
  Administered 2014-03-17 – 2014-03-18 (×2): 2 [IU] via SUBCUTANEOUS

## 2014-03-16 MED ORDER — HEPARIN SODIUM (PORCINE) 5000 UNIT/ML IJ SOLN
5000.0000 [IU] | Freq: Three times a day (TID) | INTRAMUSCULAR | Status: DC
  Administered 2014-03-17 – 2014-03-20 (×11): 5000 [IU] via SUBCUTANEOUS
  Filled 2014-03-16 (×14): qty 1

## 2014-03-16 MED ORDER — DULOXETINE HCL 30 MG PO CPEP
30.0000 mg | ORAL_CAPSULE | Freq: Every day | ORAL | Status: DC
  Administered 2014-03-17: 30 mg via ORAL
  Filled 2014-03-16: qty 1

## 2014-03-16 MED ORDER — HYDRALAZINE HCL 25 MG PO TABS
25.0000 mg | ORAL_TABLET | Freq: Three times a day (TID) | ORAL | Status: DC
  Administered 2014-03-16 – 2014-03-20 (×12): 25 mg via ORAL
  Filled 2014-03-16 (×14): qty 1

## 2014-03-16 MED ORDER — AMLODIPINE BESYLATE 10 MG PO TABS
10.0000 mg | ORAL_TABLET | Freq: Every day | ORAL | Status: DC
  Administered 2014-03-17 – 2014-03-20 (×4): 10 mg via ORAL
  Filled 2014-03-16 (×5): qty 1

## 2014-03-16 MED ORDER — DEXTROSE 5 % IV SOLN
2.0000 g | Freq: Once | INTRAVENOUS | Status: AC
  Administered 2014-03-16: 2 g via INTRAVENOUS

## 2014-03-16 MED ORDER — POTASSIUM CHLORIDE CRYS ER 20 MEQ PO TBCR: 40.0000 meq | EXTENDED_RELEASE_TABLET | Freq: Once | ORAL | Status: DC

## 2014-03-16 MED ORDER — CALCITRIOL 0.25 MCG PO CAPS
0.2500 ug | ORAL_CAPSULE | Freq: Every day | ORAL | Status: DC
  Administered 2014-03-17 – 2014-03-20 (×4): 0.25 ug via ORAL
  Filled 2014-03-16 (×4): qty 1

## 2014-03-16 MED ORDER — SODIUM CHLORIDE 0.9 % IJ SOLN: 3.0000 mL | INTRAMUSCULAR | Status: DC | PRN

## 2014-03-16 MED ORDER — ALBUTEROL SULFATE (2.5 MG/3ML) 0.083% IN NEBU
2.5000 mg | INHALATION_SOLUTION | RESPIRATORY_TRACT | Status: DC | PRN
  Administered 2014-03-16 – 2014-03-18 (×2): 2.5 mg via RESPIRATORY_TRACT
  Filled 2014-03-16 (×3): qty 3

## 2014-03-16 MED ORDER — SODIUM CHLORIDE 0.9 % IJ SOLN
3.0000 mL | Freq: Two times a day (BID) | INTRAMUSCULAR | Status: DC
  Administered 2014-03-17 – 2014-03-20 (×8): 3 mL via INTRAVENOUS

## 2014-03-16 MED ORDER — POTASSIUM CHLORIDE IN NACL 20-0.9 MEQ/L-% IV SOLN
Freq: Once | INTRAVENOUS | Status: AC
  Administered 2014-03-16: 16:00:00 via INTRAVENOUS
  Filled 2014-03-16: qty 1000

## 2014-03-16 MED ORDER — VANCOMYCIN HCL IN DEXTROSE 1-5 GM/200ML-% IV SOLN
1000.0000 mg | Freq: Once | INTRAVENOUS | Status: DC
Start: 2014-03-16 — End: 2014-03-16

## 2014-03-16 MED ORDER — POTASSIUM CHLORIDE CRYS ER 20 MEQ PO TBCR
20.0000 meq | EXTENDED_RELEASE_TABLET | Freq: Two times a day (BID) | ORAL | Status: AC
  Administered 2014-03-16 – 2014-03-17 (×2): 20 meq via ORAL
  Filled 2014-03-16 (×2): qty 1

## 2014-03-16 MED ORDER — MOMETASONE FURO-FORMOTEROL FUM 100-5 MCG/ACT IN AERO
2.0000 | INHALATION_SPRAY | Freq: Two times a day (BID) | RESPIRATORY_TRACT | Status: DC
  Administered 2014-03-16 – 2014-03-20 (×7): 2 via RESPIRATORY_TRACT
  Filled 2014-03-16: qty 8.8

## 2014-03-16 MED ORDER — ALBUTEROL SULFATE (2.5 MG/3ML) 0.083% IN NEBU
5.0000 mg | INHALATION_SOLUTION | Freq: Once | RESPIRATORY_TRACT | Status: AC
  Administered 2014-03-16: 5 mg via RESPIRATORY_TRACT
  Filled 2014-03-16: qty 6

## 2014-03-16 MED ORDER — ASPIRIN EC 81 MG PO TBEC
81.0000 mg | DELAYED_RELEASE_TABLET | Freq: Every day | ORAL | Status: DC
  Administered 2014-03-17 – 2014-03-20 (×4): 81 mg via ORAL
  Filled 2014-03-16 (×4): qty 1

## 2014-03-16 MED ORDER — INSULIN NPH (HUMAN) (ISOPHANE) 100 UNIT/ML ~~LOC~~ SUSP
30.0000 [IU] | Freq: Two times a day (BID) | SUBCUTANEOUS | Status: DC
  Administered 2014-03-17 – 2014-03-18 (×3): 30 [IU] via SUBCUTANEOUS
  Filled 2014-03-16: qty 10

## 2014-03-16 MED ORDER — POTASSIUM CHLORIDE CRYS ER 20 MEQ PO TBCR
20.0000 meq | EXTENDED_RELEASE_TABLET | Freq: Two times a day (BID) | ORAL | Status: DC
  Administered 2014-03-16 – 2014-03-20 (×7): 20 meq via ORAL
  Filled 2014-03-16 (×11): qty 1

## 2014-03-16 MED ORDER — VITAMIN D (ERGOCALCIFEROL) 1.25 MG (50000 UNIT) PO CAPS: 50000.0000 [IU] | ORAL_CAPSULE | ORAL | Status: DC

## 2014-03-16 MED ORDER — DEXTROSE 5 % IV SOLN
1.0000 g | Freq: Three times a day (TID) | INTRAVENOUS | Status: DC
  Administered 2014-03-17 (×2): 1 g via INTRAVENOUS
  Filled 2014-03-16 (×4): qty 1

## 2014-03-16 MED ORDER — DEXTROSE 5 % IV SOLN: 2.0000 g | INTRAVENOUS | Status: DC

## 2014-03-16 MED ORDER — INSULIN ASPART 100 UNIT/ML ~~LOC~~ SOLN
0.0000 [IU] | Freq: Three times a day (TID) | SUBCUTANEOUS | Status: DC
  Administered 2014-03-17 (×2): 2 [IU] via SUBCUTANEOUS
  Administered 2014-03-17 – 2014-03-18 (×2): 3 [IU] via SUBCUTANEOUS
  Administered 2014-03-18: 1 [IU] via SUBCUTANEOUS
  Administered 2014-03-19 (×3): 2 [IU] via SUBCUTANEOUS
  Administered 2014-03-20: 1 [IU] via SUBCUTANEOUS

## 2014-03-16 MED ORDER — ATORVASTATIN CALCIUM 20 MG PO TABS
20.0000 mg | ORAL_TABLET | Freq: Every day | ORAL | Status: DC
  Administered 2014-03-17 – 2014-03-19 (×3): 20 mg via ORAL
  Filled 2014-03-16 (×4): qty 1

## 2014-03-16 MED ORDER — OMEGA-3-ACID ETHYL ESTERS 1 G PO CAPS
1.0000 g | ORAL_CAPSULE | Freq: Two times a day (BID) | ORAL | Status: DC
  Administered 2014-03-16 – 2014-03-20 (×8): 1 g via ORAL
  Filled 2014-03-16 (×11): qty 1

## 2014-03-16 MED ORDER — VANCOMYCIN HCL 10 G IV SOLR
2000.0000 mg | INTRAVENOUS | Status: AC
  Administered 2014-03-16: 2000 mg via INTRAVENOUS
  Filled 2014-03-16: qty 2000

## 2014-03-16 MED ORDER — SODIUM CHLORIDE 0.9 % IV SOLN: 250.0000 mL | INTRAVENOUS | Status: DC | PRN

## 2014-03-16 MED ORDER — IPRATROPIUM BROMIDE 0.02 % IN SOLN
0.5000 mg | Freq: Once | RESPIRATORY_TRACT | Status: AC
  Administered 2014-03-16: 0.5 mg via RESPIRATORY_TRACT
  Filled 2014-03-16: qty 2.5

## 2014-03-16 MED ORDER — ACETAMINOPHEN 325 MG PO TABS
650.0000 mg | ORAL_TABLET | Freq: Four times a day (QID) | ORAL | Status: DC | PRN
  Administered 2014-03-16 – 2014-03-19 (×2): 650 mg via ORAL
  Filled 2014-03-16 (×2): qty 2

## 2014-03-16 MED ORDER — TIOTROPIUM BROMIDE MONOHYDRATE 18 MCG IN CAPS
18.0000 ug | ORAL_CAPSULE | Freq: Every day | RESPIRATORY_TRACT | Status: DC
  Administered 2014-03-17 – 2014-03-20 (×3): 18 ug via RESPIRATORY_TRACT
  Filled 2014-03-16: qty 5

## 2014-03-16 MED ORDER — VANCOMYCIN HCL 10 G IV SOLR: 1500.0000 mg | INTRAVENOUS | Status: DC

## 2014-03-16 MED ORDER — TAMSULOSIN HCL 0.4 MG PO CAPS
0.4000 mg | ORAL_CAPSULE | Freq: Every day | ORAL | Status: DC
  Administered 2014-03-17 – 2014-03-20 (×4): 0.4 mg via ORAL
  Filled 2014-03-16 (×4): qty 1

## 2014-03-16 MED ORDER — INSULIN ASPART 100 UNIT/ML ~~LOC~~ SOLN
3.0000 [IU] | Freq: Three times a day (TID) | SUBCUTANEOUS | Status: DC
  Administered 2014-03-17: 3 [IU] via SUBCUTANEOUS

## 2014-03-16 NOTE — Progress Notes (Signed)
4:32 PM Attempted to receive from ED for admission to 5W29  4:54 PM Report received

## 2014-03-16 NOTE — ED Notes (Signed)
Notified RN of CBG 157

## 2014-03-16 NOTE — Progress Notes (Signed)
ANTIBIOTIC CONSULT NOTE - INITIAL  Pharmacy Consult for cefepime and vancomycin Indication: pneumonia  Allergies  Allergen Reactions  . Ambien [Zolpidem] Other (See Comments)    "becomes violent"  . Bupropion Hcl     REACTION: shaking    Patient Measurements:   Adjusted Body Weight: ~ 100kg  Vital Signs: Temp: 98 F (36.7 C) (04/04 1238) Temp src: Oral (04/04 1238) BP: 160/63 mmHg (04/04 1238) Pulse Rate: 80 (04/04 1238) Intake/Output from previous day:   Intake/Output from this shift:    Labs:  Recent Labs  03/16/14 1309  WBC 6.7  HGB 10.9*  PLT 169  CREATININE 2.07*   The CrCl is unknown because both a height and weight (above a minimum accepted value) are required for this calculation. No results found for this basename: VANCOTROUGH, VANCOPEAK, VANCORANDOM, GENTTROUGH, GENTPEAK, GENTRANDOM, TOBRATROUGH, TOBRAPEAK, TOBRARND, AMIKACINPEAK, AMIKACINTROU, AMIKACIN,  in the last 72 hours   Microbiology: No results found for this or any previous visit (from the past 720 hour(s)).  Medical History: Past Medical History  Diagnosis Date  . Hypertension   . OSA (obstructive sleep apnea)   . Chronic kidney disease     with proteinuria  . Hyperlipidemia   . Type 2 diabetes mellitus   . COPD (chronic obstructive pulmonary disease)     with exacerbation in the past  . Chronic diastolic heart failure     Medications:  See home med list Assessment: 78 year old man [facility] who [facility] with hemoptysis.  Vancomycin and cefepime to start for HCAP.  Goal of Therapy:  Vancomycin trough level 15-20 mcg/ml  Plan:  Measure antibiotic drug levels at steady state Follow up culture results Vancomycin 2g IV x 1 dose, then 1.5g daily Cefepime 2g IV daily  Mickeal SkinnerFrens, Rebbecca Osuna John 03/16/2014,3:52 PM

## 2014-03-16 NOTE — H&P (Signed)
[facility] History and Physical  RYKKER COVIELLO UJW:[id number] DOB: 1936-02-23 DOA: 03/16/2014  Referring physician: Dr. Karma Ganja PCP: Kathlee Nations, MD   Chief Complaint: hemoptysis  HPI: Douglas [name] is a 78 y.o. male  Past medical history of diabetes mellitus with an unknown hemoglobin A1c, chronic kidney disease with unknown baseline creatinine, also questionable heart failure. At times in for shortness of breath that started the day of admission along with hemoptysis. He relates it has progressively gotten worse. He can even go to the bathroom without not being short of breath. He relates calls about his pool of blood every time he coughs. Denies any fevers but is he does admit to chills at night. He also related that last night he woke up in a sweat.  In the ED: Basic metabolic panel was done as below CBC with a differential was done that shows a white count of 6.7 with an ANC of 4.7 his lactic acid was 1.0, his pro BNP was 3251 his first set of cardiac enzymes are negative.   Review of Systems:  Constitutional:  No weight loss, night sweats, Fevers, chills, fatigue.  HEENT:  No headaches, Difficulty swallowing,Tooth/dental problems,Sore throat,  No sneezing, itching, ear ache, nasal congestion, post nasal drip,  Cardio-vascular:  No chest pain, Orthopnea, PND, swelling in lower extremities, anasarca, dizziness, palpitations  GI:  No heartburn, indigestion, abdominal pain, nausea, vomiting, diarrhea, change in bowel habits, loss of appetite  Resp:  No wheezing.No chest wall deformity  Skin:  no rash or lesions.  GU:  no dysuria, change in color of urine, no urgency or frequency. No flank pain.  Musculoskeletal:  No joint pain or swelling. No decreased range of motion. No back pain.  Psych:  No change in mood or affect. No depression or anxiety. No memory loss.   Past Medical History  Diagnosis Date  . Hypertension   . OSA (obstructive sleep apnea)   . Chronic kidney  disease     with proteinuria  . Hyperlipidemia   . Type 2 diabetes mellitus   . COPD (chronic obstructive pulmonary disease)     with exacerbation in the past  . Chronic diastolic heart failure    Past Surgical History  Procedure Laterality Date  . Cardiac catheterization  10/05/2011     normal filling pressures with a wedge of 10 and a PAS of  42.  In the coronary arteriogram, the LAD had a 20% proximal stenosisand the RCA had a 30% mid stenosis with no other disease noted    . Back surgery    . Kidney stone surgery    . Appendectomy    . Hernia repair     Social History:  reports that he quit smoking about 2 years ago. His smoking use included Cigarettes. He has a 130 pack-year smoking history. He has never used smokeless tobacco. He reports that he does not drink alcohol. His drug history is not on file.  Allergies  Allergen Reactions  . Ambien [Zolpidem] Other (See Comments)    "becomes violent"  . Bupropion Hcl     REACTION: shaking    Family History  Problem Relation Age of Onset  . Bronchitis Mother   . Colon cancer Father      Prior to Admission medications   Medication Sig Start Date End Date Taking? Authorizing Provider  allopurinol (ZYLOPRIM) 100 MG tablet Take 100 mg by mouth every morning.   Yes Historical Provider, MD  amLODipine (NORVASC) 10 MG tablet  Take 10 mg by mouth daily.   Yes Historical Provider, MD  aspirin EC 81 MG tablet Take 81 mg by mouth daily.     Yes Historical Provider, MD  calcitRIOL (ROCALTROL) 0.25 MCG capsule Take 0.25 mcg by mouth daily.   Yes Historical Provider, MD  citalopram (CELEXA) 40 MG tablet Take 40 mg by mouth daily.   Yes Historical Provider, MD  DULoxetine (CYMBALTA) 60 MG capsule Take 60 mg by mouth daily.   Yes Historical Provider, MD  Fluticasone-Salmeterol (ADVAIR DISKUS) 250-50 MCG/DOSE AEPB Inhale 1 puff into the lungs every 12 (twelve) hours.     Yes Historical Provider, MD  furosemide (LASIX) 40 MG tablet Take 40-80 mg  by mouth 2 (two) times daily. Take 2 tablets (80 mg) in the morning and 1 tablet (40 mg) in the evening.  May take an extra dose in the evening for increased swelling.   Yes Historical Provider, MD  hydrALAZINE (APRESOLINE) 25 MG tablet Take 25 mg by mouth 3 (three) times daily.    Yes Historical Provider, MD  insulin NPH (HUMULIN N,NOVOLIN N) 100 UNIT/ML injection Inject 50 Units into the skin 2 (two) times daily before a meal.    Yes Historical Provider, MD  insulin regular (HUMULIN R,NOVOLIN R) 100 units/mL injection Inject 20 Units into the skin 3 (three) times daily before meals.    Yes Historical Provider, MD  metolazone (ZAROXOLYN) 5 MG tablet Take 5 mg by mouth daily.   Yes Historical Provider, MD  omega-3 acid ethyl esters (LOVAZA) 1 G capsule Take 1 g by mouth 2 (two) times daily.   Yes Historical Provider, MD  potassium chloride SA (K-DUR,KLOR-CON) 20 MEQ tablet Take 20 mEq by mouth 2 (two) times daily.   Yes Historical Provider, MD  propranolol (INDERAL) 10 MG tablet Take 10 mg by mouth 2 (two) times daily.   Yes Historical Provider, MD  rosuvastatin (CRESTOR) 10 MG tablet Take 10 mg by mouth daily.    Yes Historical Provider, MD  Tamsulosin HCl (FLOMAX) 0.4 MG CAPS Take 0.4 mg by mouth daily.    Yes Historical Provider, MD  tiotropium (SPIRIVA) 18 MCG inhalation capsule Place 18 mcg into inhaler and inhale daily.     Yes Historical Provider, MD  traZODone (DESYREL) 50 MG tablet Take 50 mg by mouth at bedtime.   Yes Historical Provider, MD  Vitamin D, Ergocalciferol, (DRISDOL) 50000 UNITS CAPS capsule Take 50,000 Units by mouth every Thursday.   Yes Historical Provider, MD   Physical Exam: Filed Vitals:   03/16/14 1445  BP: 146/69  Pulse: 79  Temp:   Resp: 24    BP 146/69  Pulse 79  Temp(Src) 98 F (36.7 C) (Oral)  Resp 24  SpO2 93%  General:  Appears calm and comfortable Eyes: PERRL, normal lids, irises & conjunctiva ENT: grossly normal hearing, lips & tongue Neck: no  LAD, masses or thyromegaly, positive JVD Cardiovascular: RRR, no m/r/g. 2+ lower chin edema Telemetry: SR, no arrhythmias  Respiratory: Moderate air movement with rhonchi bilaterally no wheezing appreciated. Abdomen: soft, ntnd Skin: no rash or induration seen on limited exam Psychiatric: grossly normal mood and affect, speech fluent and appropriate Neurologic: grossly non-focal.          Labs on Admission:  Basic Metabolic Panel:  Recent Labs Lab 03/16/14 1309  NA 135*  K 2.9*  CL 95*  CO2 26  GLUCOSE 105*  BUN 50*  CREATININE 2.07*  CALCIUM 7.9*   Liver Function  Tests:  Recent Labs Lab 03/16/14 1309  AST 22  ALT 33  ALKPHOS 98  BILITOT 0.8  PROT 6.4  ALBUMIN 2.5*   No results found for this basename: LIPASE, AMYLASE,  in the last 168 hours No results found for this basename: AMMONIA,  in the last 168 hours CBC:  Recent Labs Lab 03/16/14 1309  WBC 6.7  NEUTROABS 4.7  HGB 10.9*  HCT 31.7*  MCV 91.1  PLT 169   Cardiac Enzymes: No results found for this basename: CKTOTAL, CKMB, CKMBINDEX, TROPONINI,  in the last 168 hours  BNP (last 3 results)  Recent Labs  03/16/14 1309  PROBNP 3251.0*   CBG:  Recent Labs Lab 03/16/14 1248  GLUCAP 157*    Radiological Exams on Admission: Dg Chest 2 View  03/16/2014   CLINICAL DATA:  Cough.  Hemoptysis.  EXAM: CHEST  2 VIEW  COMPARISON:  10/31/2008  FINDINGS: The cardiac silhouette, mediastinal and hilar contours are within normal limits and stable. There is tortuosity and calcification of the thoracic aorta. There are suspected underlying emphysematous changes with superimposed bilateral infiltrates. Small bilateral pleural effusions are noted. The bony thorax is intact.  IMPRESSION: Suspect underlying emphysematous changes.  Bilateral lung infiltrates.  Small bilateral pleural effusions.   Electronically Signed   By: Loralie ChampagneMark  Gallerani M.D.   On: 03/16/2014 15:27    EKG: Independently reviewed. First-degree A-V  block with  premature atrial complexes  Assessment/Plan:  Healthcare-associated pneumonia - Agree with Vanc and cefepime, cont per pharmacy. - Blood culture, check a CT of the chest to evaluate his lung parenchyma. - Tylenol for fevers. Likely as it was within normal limits. Patient is able to take oral.  HYPERLIPIDEMIA - cont. Statin.  Chronic renal disease, stage 3, moderately decreased glomerular filtration rate (GFR) between 30-59 mL/min/1.73 square meter - Unknown baseline but definitely has chronic renal disease. At this time it is 2.0. I will have hold his diuretics as he seemed dehydrated. - Hydrate him orally interface metabolic panel in the morning. Has able to take by mouth's.  Hyponatremia - Check urinary sodium urinary creatinine. - Seems to be mildly dehydrated. Oral hydration orally.  Hypokalemia: - replete check a mag.  Major depression: - consult psyq. Medication adjustment.   Code Status: full Family Communication: daughter Disposition Plan: inpatient  Time spent: 90 minutes  Marinda ElkFELIZ ORTIZ, ABRAHAM [facility] Pager [phone]

## 2014-03-16 NOTE — ED Notes (Signed)
PT to CT.

## 2014-03-16 NOTE — Progress Notes (Signed)
Douglas [name] [id number] Code Status: FULL Admission Data: 03/16/2014 7:41 PM Attending Provider:  Feliz Ortiz MWN:UUVOZ,DGUY, MD Consults/ Treatment Team:  [facility]   Douglas [name] is a 78 y.o. male patient admitted from ED awake, alert - oriented  X 3 - no acute distress noted.  VSS - Blood pressure 158/60, pulse 84, temperature 98.4 F (36.9 C), temperature source Axillary, resp. rate 17, height 6' (1.829 m), weight 106.686 kg (235 lb 3.2 oz), SpO2 94.00%.  no c/o shortness of breath, no c/o chest pain.  O2:   3.5L/min [place]. IV Fluids:  IV in place, occlusive dsg intact without redness, IV cath forearm left, condition patent and no redness none.  Allergies:   Allergies  Allergen Reactions  . Ambien [Zolpidem] Other (See Comments)    "becomes violent"  . Bupropion Hcl     REACTION: shaking     Past Medical History  Diagnosis Date  . Hypertension   . OSA (obstructive sleep apnea)   . Chronic kidney disease     with proteinuria  . Hyperlipidemia   . Type 2 diabetes mellitus   . COPD (chronic obstructive pulmonary disease)     with exacerbation in the past  . Chronic diastolic heart failure    Medications Prior to Admission  Medication Sig Dispense Refill  . allopurinol (ZYLOPRIM) 100 MG tablet Take 100 mg by mouth every morning.      Marland Kitchen amLODipine (NORVASC) 10 MG tablet Take 10 mg by mouth daily.      Marland Kitchen aspirin EC 81 MG tablet Take 81 mg by mouth daily.        . calcitRIOL (ROCALTROL) 0.25 MCG capsule Take 0.25 mcg by mouth daily.      . citalopram (CELEXA) 40 MG tablet Take 40 mg by mouth daily.      . DULoxetine (CYMBALTA) 60 MG capsule Take 60 mg by mouth daily.      . Fluticasone-Salmeterol (ADVAIR DISKUS) 250-50 MCG/DOSE AEPB Inhale 1 puff into the lungs every 12 (twelve) hours.        . furosemide (LASIX) 40 MG tablet Take 40-80 mg by mouth 2 (two) times daily. Take 2 tablets (80 mg) in the morning and 1 tablet (40 mg) in the evening.  May take an extra dose in the evening  for increased swelling.      . hydrALAZINE (APRESOLINE) 25 MG tablet Take 25 mg by mouth 3 (three) times daily.       . insulin NPH (HUMULIN N,NOVOLIN N) 100 UNIT/ML injection Inject 50 Units into the skin 2 (two) times daily before a meal.       . insulin regular (HUMULIN R,NOVOLIN R) 100 units/mL injection Inject 20 Units into the skin 3 (three) times daily before meals.       . metolazone (ZAROXOLYN) 5 MG tablet Take 5 mg by mouth daily.      Marland Kitchen omega-3 acid ethyl esters (LOVAZA) 1 G capsule Take 1 g by mouth 2 (two) times daily.      . potassium chloride SA (K-DUR,KLOR-CON) 20 MEQ tablet Take 20 mEq by mouth 2 (two) times daily.      . propranolol (INDERAL) 10 MG tablet Take 10 mg by mouth 2 (two) times daily.      . rosuvastatin (CRESTOR) 10 MG tablet Take 10 mg by mouth daily.       . Tamsulosin HCl (FLOMAX) 0.4 MG CAPS Take 0.4 mg by mouth daily.       Marland Kitchen  tiotropium (SPIRIVA) 18 MCG inhalation capsule Place 18 mcg into inhaler and inhale daily.        . traZODone (DESYREL) 50 MG tablet Take 50 mg by mouth at bedtime.      . Vitamin D, Ergocalciferol, (DRISDOL) 50000 UNITS CAPS capsule Take 50,000 Units by mouth every Thursday.       History:  obtained from the patient.  Orientation to room, and floor completed with information packet given to patient/family.  Patient declined safety video at this time.  Admission INP armband ID verified with patient/family, and in place.   SR up x 2, fall assessment complete, with patient and family able to verbalize understanding of risk associated with falls, and verbalized understanding to call nsg before up out of bed.  Call light within reach, patient able to voice, and demonstrate understanding.  Skin, clean-dry- intact without evidence of bruising, or skin tears.   No evidence of skin break down noted on exam.     Will cont to eval and treat per MD orders.  Aldean AstLEsperance, Madisin Hasan C, RN 03/16/2014 7:41 PM

## 2014-03-16 NOTE — ED Notes (Signed)
[facility], pt started to cough  Up blood today, denies CP, SOB, N/V/D, pt denies increase in SOB today, pt A&O x4 follows commands, speaks in complete sentences, denies pain in route

## 2014-03-16 NOTE — ED Provider Notes (Signed)
CSN: [id number]     Arrival date & time 03/16/14  1232 History   First MD Initiated Contact with Patient 03/16/14 1256     Chief Complaint  Patient presents with  . Hemoptysis     (Consider location/radiation/quality/duration/timing/severity/associated sxs/prior Treatment) HPI Comments: Patient is a 78 year old male with history of hypertension, obstructive sleep apnea, chronic kidney disease, hyperlipidemia, diabetes, COPD, diastolic heart failure who presents today with shortness of breath and hemoptysis. He reports that this all began on Tuesday. He developed nausea, vomiting, diarrhea on Tuesday. He was admitted to [facility] where an NG tube was placed. He was discharged from [facility] last night. He woke up this morning feeling short of breath and had blood tinged sputum. He has audible wheezing. He denies any chest pain, fevers, chills. He denies any history of DVT or PE. He has been smoking cigarettes since he was 78 years old. He quit smoking 3 years ago. He does not use oxygen at home. He has never been intubated.  The history is provided by the patient. No language interpreter was used.    Past Medical History  Diagnosis Date  . Hypertension   . OSA (obstructive sleep apnea)   . Chronic kidney disease     with proteinuria  . Hyperlipidemia   . Type 2 diabetes mellitus   . COPD (chronic obstructive pulmonary disease)     with exacerbation in the past  . Chronic diastolic heart failure    Past Surgical History  Procedure Laterality Date  . Cardiac catheterization  10/05/2011     normal filling pressures with a wedge of 10 and a PAS of  42.  In the coronary arteriogram, the LAD had a 20% proximal stenosisand the RCA had a 30% mid stenosis with no other disease noted    . Back surgery    . Kidney stone surgery    . Appendectomy    . Hernia repair     No family history on file. History  Substance Use Topics  . Smoking status: Former Smoker -- 2.00 packs/day for 65  years    Types: Cigarettes    Quit date: 07/14/2011  . Smokeless tobacco: Never Used  . Alcohol Use: No    Review of Systems  Constitutional: Negative for fever and chills.  Respiratory: Positive for cough and shortness of breath.   Cardiovascular: Negative for chest pain and leg swelling.  Gastrointestinal: Negative for nausea, vomiting (resolved), abdominal pain (resolved) and diarrhea (resolved).  All other systems reviewed and are negative.      Allergies  Ambien and Bupropion hcl  Home Medications   Current Outpatient Rx  Name  Route  Sig  Dispense  Refill  . allopurinol (ZYLOPRIM) 100 MG tablet   Oral   Take 100 mg by mouth every morning.         Marland Kitchen amLODipine (NORVASC) 10 MG tablet   Oral   Take 10 mg by mouth daily.         Marland Kitchen aspirin EC 81 MG tablet   Oral   Take 81 mg by mouth daily.           . calcitRIOL (ROCALTROL) 0.25 MCG capsule   Oral   Take 0.25 mcg by mouth daily.         . citalopram (CELEXA) 40 MG tablet   Oral   Take 40 mg by mouth daily.         . DULoxetine (CYMBALTA) 60 MG capsule  Oral   Take 60 mg by mouth daily.         . Fluticasone-Salmeterol (ADVAIR DISKUS) 250-50 MCG/DOSE AEPB   Inhalation   Inhale 1 puff into the lungs every 12 (twelve) hours.           . furosemide (LASIX) 40 MG tablet   Oral   Take 40-80 mg by mouth 2 (two) times daily. Take 2 tablets (80 mg) in the morning and 1 tablet (40 mg) in the evening.  May take an extra dose in the evening for increased swelling.         . hydrALAZINE (APRESOLINE) 25 MG tablet   Oral   Take 25 mg by mouth 3 (three) times daily.          . insulin NPH (HUMULIN N,NOVOLIN N) 100 UNIT/ML injection   Subcutaneous   Inject 50 Units into the skin 2 (two) times daily before a meal.          . insulin regular (HUMULIN R,NOVOLIN R) 100 units/mL injection   Subcutaneous   Inject 20 Units into the skin 3 (three) times daily before meals.          . metolazone  (ZAROXOLYN) 5 MG tablet   Oral   Take 5 mg by mouth daily.         Marland Kitchen omega-3 acid ethyl esters (LOVAZA) 1 G capsule   Oral   Take 1 g by mouth 2 (two) times daily.         . potassium chloride SA (K-DUR,KLOR-CON) 20 MEQ tablet   Oral   Take 20 mEq by mouth 2 (two) times daily.         . propranolol (INDERAL) 10 MG tablet   Oral   Take 10 mg by mouth 2 (two) times daily.         . rosuvastatin (CRESTOR) 10 MG tablet   Oral   Take 10 mg by mouth daily.          . Tamsulosin HCl (FLOMAX) 0.4 MG CAPS   Oral   Take 0.4 mg by mouth daily.          Marland Kitchen tiotropium (SPIRIVA) 18 MCG inhalation capsule   Inhalation   Place 18 mcg into inhaler and inhale daily.           . traZODone (DESYREL) 50 MG tablet   Oral   Take 50 mg by mouth at bedtime.         . Vitamin D, Ergocalciferol, (DRISDOL) 50000 UNITS CAPS capsule   Oral   Take 50,000 Units by mouth every Thursday.          BP 160/63  Pulse 80  Temp(Src) 98 F (36.7 C) (Oral)  Resp 18  SpO2 97% Physical Exam  Nursing note and vitals reviewed. Constitutional: He is oriented to person, place, and time. He appears well-developed and well-nourished. No distress.  HENT:  Head: Normocephalic and atraumatic.  Right Ear: External ear normal.  Left Ear: External ear normal.  Nose: Nose normal.  Eyes: Conjunctivae are normal.  Neck: Normal range of motion. No tracheal deviation present.  Cardiovascular: Normal rate, regular rhythm and normal heart sounds.   Pulmonary/Chest: Effort normal. No stridor. He has wheezes. He has rales.  Speaking in full sentences  Abdominal: Soft. He exhibits no distension. There is no tenderness.  Musculoskeletal: Normal range of motion.  Neurological: He is alert and oriented to person, place, and time.  Skin: Skin is  warm and dry. He is not diaphoretic.  Psychiatric: He has a normal mood and affect. His behavior is normal.    ED Course  Procedures (including critical care  time) Labs Review Labs Reviewed  CBC WITH DIFFERENTIAL - Abnormal; Notable for the following:    RBC 3.48 (*)    Hemoglobin 10.9 (*)    HCT 31.7 (*)    Lymphocytes Relative 8 (*)    Lymphs Abs 0.5 (*)    Monocytes Relative 22 (*)    Monocytes Absolute 1.5 (*)    All other components within normal limits  COMPREHENSIVE METABOLIC PANEL - Abnormal; Notable for the following:    Sodium 135 (*)    Potassium 2.9 (*)    Chloride 95 (*)    Glucose, Bld 105 (*)    BUN 50 (*)    Creatinine, Ser 2.07 (*)    Calcium 7.9 (*)    Albumin 2.5 (*)    GFR calc non Af Amer 30 (*)    GFR calc Af Amer 34 (*)    All other components within normal limits  PRO B NATRIURETIC PEPTIDE - Abnormal; Notable for the following:    Pro B Natriuretic peptide (BNP) 3251.0 (*)    All other components within normal limits  URINALYSIS, ROUTINE W REFLEX MICROSCOPIC - Abnormal; Notable for the following:    Glucose, UA 250 (*)    Protein, ur 100 (*)    All other components within normal limits  URINE MICROSCOPIC-ADD ON - Abnormal; Notable for the following:    Squamous Epithelial / LPF FEW (*)    All other components within normal limits  CBG MONITORING, ED - Abnormal; Notable for the following:    Glucose-Capillary 157 (*)    All other components within normal limits  CULTURE, BLOOD (ROUTINE X 2)  CULTURE, BLOOD (ROUTINE X 2)  URINE CULTURE  I-STAT TROPOININ, ED  I-STAT CG4 LACTIC ACID, ED   Imaging Review Dg Chest 2 View  03/16/2014   CLINICAL DATA:  Cough.  Hemoptysis.  EXAM: CHEST  2 VIEW  COMPARISON:  10/31/2008  FINDINGS: The cardiac silhouette, mediastinal and hilar contours are within normal limits and stable. There is tortuosity and calcification of the thoracic aorta. There are suspected underlying emphysematous changes with superimposed bilateral infiltrates. Small bilateral pleural effusions are noted. The bony thorax is intact.  IMPRESSION: Suspect underlying emphysematous changes.  Bilateral lung  infiltrates.  Small bilateral pleural effusions.   Electronically Signed   By: Loralie Champagne M.D.   On: 03/16/2014 15:27     EKG Interpretation   Date/Time:  Saturday March 16 2014 13:29:13 EDT Ventricular Rate:  78 PR Interval:  217 QRS Duration: 100 QT Interval:  418 QTC Calculation: 476 R Axis:     Text Interpretation:  Sinus rhythm Atrial premature complex Borderline  prolonged PR interval Borderline low voltage, extremity leads Borderline  prolonged QT interval Since previous tracing QT and PR intervals have  increased Confirmed by Karma Ganja  MD, MARTHA [phone]) on 03/16/2014 2:52:09 PM      MDM   Final diagnoses:  Hemoptysis    Patient presents to ED with blood tinged sputum. Chest XR shows bilateral lung infiltrates and small bilateral pleural effusions. CT chest pending. Vancomycin and Cefepime started as patient was discharged from a recent hospitalization yesterday. Vital signs stable. Patient does have increased oxygen requirement. He is doing well on 4L of oxygen. Patient admitted to medicine. Admission is appreciated. Discussed case with Dr. Karma Ganja  who agrees with plan. Patient / Family / Caregiver informed of clinical course, understand medical decision-making process, and agree with plan.   Medications  vancomycin (VANCOCIN) 2,000 mg in sodium chloride 0.9 % 500 mL IVPB (2,000 mg Intravenous New Bag/Given 03/16/14 1614)  ceFEPIme (MAXIPIME) 2 g in dextrose 5 % 50 mL IVPB (not administered)  vancomycin (VANCOCIN) 1,500 mg in sodium chloride 0.9 % 500 mL IVPB (not administered)  potassium chloride SA (K-DUR,KLOR-CON) CR tablet 40 mEq (not administered)  albuterol (PROVENTIL) (2.5 MG/3ML) 0.083% nebulizer solution 5 mg (5 mg Nebulization Given 03/16/14 1330)  ipratropium (ATROVENT) nebulizer solution 0.5 mg (0.5 mg Nebulization Given 03/16/14 1330)  0.9 % NaCl with KCl 20 mEq/ L  infusion ( Intravenous New Bag/Given 03/16/14 1612)  ceFEPIme (MAXIPIME) 2 g in dextrose 5 % 50 mL  IVPB (2 g Intravenous New Bag/Given 03/16/14 1613)       Mora BellmanHannah S Kiwanna Spraker, PA-C 03/16/14 1718

## 2014-03-17 DIAGNOSIS — F339 Major depressive disorder, recurrent, unspecified: Secondary | ICD-10-CM

## 2014-03-17 LAB — GLUCOSE, CAPILLARY
GLUCOSE-CAPILLARY: 218 mg/dL — AB (ref 70–99)
GLUCOSE-CAPILLARY: 210 mg/dL — AB (ref 70–99)
Glucose-Capillary: 120 mg/dL — ABNORMAL HIGH (ref 70–99)
Glucose-Capillary: 200 mg/dL — ABNORMAL HIGH (ref 70–99)
Glucose-Capillary: 200 mg/dL — ABNORMAL HIGH (ref 70–99)

## 2014-03-17 LAB — LEGIONELLA ANTIGEN, URINE: Legionella Antigen, Urine: NEGATIVE

## 2014-03-17 LAB — HEMOGLOBIN A1C
Hgb A1c MFr Bld: 9.2 % — ABNORMAL HIGH (ref <5.7)
Mean Plasma Glucose: 217 mg/dL — ABNORMAL HIGH (ref <117)

## 2014-03-17 LAB — COMPREHENSIVE METABOLIC PANEL
ALBUMIN: 2.3 g/dL — AB (ref 3.5–5.2)
ALT: 33 U/L (ref 0–53)
AST: 28 U/L (ref 0–37)
Alkaline Phosphatase: 101 U/L (ref 39–117)
BILIRUBIN TOTAL: 0.9 mg/dL (ref 0.3–1.2)
BUN: 44 mg/dL — AB (ref 6–23)
CHLORIDE: 98 meq/L (ref 96–112)
CO2: 23 mEq/L (ref 19–32)
CREATININE: 1.97 mg/dL — AB (ref 0.50–1.35)
Calcium: 7.3 mg/dL — ABNORMAL LOW (ref 8.4–10.5)
GFR, EST AFRICAN AMERICAN: 37 mL/min — AB (ref >90)
GFR, EST NON AFRICAN AMERICAN: 31 mL/min — AB (ref >90)
GLUCOSE: 207 mg/dL — AB (ref 70–99)
Potassium: 3.2 mEq/L — ABNORMAL LOW (ref 3.7–5.3)
Sodium: 138 mEq/L (ref 137–147)
Total Protein: 5.4 g/dL — ABNORMAL LOW (ref 6.0–8.3)

## 2014-03-17 LAB — CBC
HCT: 30 % — ABNORMAL LOW (ref 39.0–52.0)
Hemoglobin: 10.3 g/dL — ABNORMAL LOW (ref 13.0–17.0)
MCH: 31.1 pg (ref 26.0–34.0)
MCHC: 34.3 g/dL (ref 30.0–36.0)
MCV: 90.6 fL (ref 78.0–100.0)
PLATELETS: 189 10*3/uL (ref 150–400)
RBC: 3.31 MIL/uL — AB (ref 4.22–5.81)
RDW: 14.1 % (ref 11.5–15.5)
WBC: 5.7 10*3/uL (ref 4.0–10.5)

## 2014-03-17 LAB — EXPECTORATED SPUTUM ASSESSMENT W REFEX TO RESP CULTURE

## 2014-03-17 LAB — EXPECTORATED SPUTUM ASSESSMENT W GRAM STAIN, RFLX TO RESP C

## 2014-03-17 LAB — LACTATE DEHYDROGENASE: LDH: 236 U/L (ref 94–250)

## 2014-03-17 LAB — HIV ANTIBODY (ROUTINE TESTING W REFLEX): HIV: NONREACTIVE

## 2014-03-17 LAB — MAGNESIUM: MAGNESIUM: 2 mg/dL (ref 1.5–2.5)

## 2014-03-17 LAB — TSH: TSH: 2.05 u[IU]/mL (ref 0.350–4.500)

## 2014-03-17 MED ORDER — CEFEPIME HCL 2 G IJ SOLR
2.0000 g | INTRAMUSCULAR | Status: DC
  Administered 2014-03-18 – 2014-03-19 (×2): 2 g via INTRAVENOUS
  Filled 2014-03-17 (×3): qty 2

## 2014-03-17 MED ORDER — TRAZODONE HCL 100 MG PO TABS
100.0000 mg | ORAL_TABLET | Freq: Every day | ORAL | Status: DC
  Administered 2014-03-17 – 2014-03-19 (×3): 100 mg via ORAL
  Filled 2014-03-17 (×4): qty 1

## 2014-03-17 MED ORDER — POTASSIUM CHLORIDE CRYS ER 20 MEQ PO TBCR
40.0000 meq | EXTENDED_RELEASE_TABLET | Freq: Once | ORAL | Status: AC
  Administered 2014-03-17: 40 meq via ORAL
  Filled 2014-03-17: qty 2

## 2014-03-17 MED ORDER — VANCOMYCIN HCL 10 G IV SOLR
1500.0000 mg | INTRAVENOUS | Status: DC
  Administered 2014-03-17 – 2014-03-19 (×3): 1500 mg via INTRAVENOUS
  Filled 2014-03-17 (×4): qty 1500

## 2014-03-17 MED ORDER — CITALOPRAM HYDROBROMIDE 40 MG PO TABS
60.0000 mg | ORAL_TABLET | Freq: Every day | ORAL | Status: DC
  Administered 2014-03-18 – 2014-03-20 (×3): 60 mg via ORAL
  Filled 2014-03-17 (×3): qty 1

## 2014-03-17 MED ORDER — BIOTENE DRY MOUTH MT LIQD
15.0000 mL | Freq: Two times a day (BID) | OROMUCOSAL | Status: DC
  Administered 2014-03-17 – 2014-03-19 (×6): 15 mL via OROMUCOSAL

## 2014-03-17 MED ORDER — BIOTENE DRY MOUTH MT LIQD: 15.0000 mL | OROMUCOSAL | Status: DC | PRN

## 2014-03-17 MED ORDER — CHLORHEXIDINE GLUCONATE 0.12 % MT SOLN
15.0000 mL | Freq: Two times a day (BID) | OROMUCOSAL | Status: DC
  Administered 2014-03-17 – 2014-03-20 (×6): 15 mL via OROMUCOSAL
  Filled 2014-03-17 (×9): qty 15

## 2014-03-17 NOTE — ED Provider Notes (Signed)
Medical screening examination/treatment/procedure(s) were performed by non-physician practitioner and as supervising physician I was immediately available for consultation/collaboration.   EKG Interpretation   Date/Time:  Saturday March 16 2014 13:29:13 EDT Ventricular Rate:  78 PR Interval:  217 QRS Duration: 100 QT Interval:  418 QTC Calculation: 476 R Axis:     Text Interpretation:  Sinus rhythm Atrial premature complex Borderline  prolonged PR interval Borderline low voltage, extremity leads Borderline  prolonged QT interval Since previous tracing QT and PR intervals have  increased Confirmed by Karma GanjaLINKER  MD, MARTHA [phone]) on 03/16/2014 2:52:09 PM       Ethelda ChickMartha K Linker, MD 03/17/14 [id number]

## 2014-03-17 NOTE — Progress Notes (Signed)
Utilization review completed.  

## 2014-03-17 NOTE — Progress Notes (Addendum)
Patient on sliding scale, meal coverage insulin and NPH. Dr. Gonzella Lexhungel made aware that 2 units sliding scale and 3 units meal coverage given this AM to patient, along with 30 units of NPH. Asked if Dr. Gonzella Lexhungel wanted to continue meal coverage considering patient on NPH. Will discontinue meal coverage order per Dr. Gonzella Lexhungel

## 2014-03-17 NOTE — Consult Note (Signed)
Reason for Consult: medication management for depression Referring Physician: Charlynne Cousins, MD   Douglas [name] is an 78 y.o. male.  Douglas [name] is a 78 y.o. male  Patient was seen and chart reviewed. Information obtained for this evaluation with face to face interview, and talking with his daughter who was at bed side. Reportedly he has been receiving medication treatment from primary care physician. He has been treated with celexa and than recently added cymbalta. Patient and his daughter noted that he has been more depressed, irritable, not eating or sleeping well even thought has desire to eat junk food or fried food. He has denied suicidal or homicidal ideations. He is upset and argumentative with daughter regarding eating fried food and hot dog etc.   Medical history. Past medical history of diabetes mellitus with an unknown hemoglobin A1c, chronic kidney disease with unknown baseline creatinine, also questionable heart failure. At times in for shortness of breath that started the day of admission along with hemoptysis. He relates it has progressively gotten worse. He can even go to the bathroom without not being short of breath. He relates calls about his pool of blood every time he coughs. Denies any fevers but is he does admit to chills at night. He also related that last night he woke up in a sweat.   Review of Systems:  Constitutional:  No weight loss, night sweats, Fevers, chills, fatigue.  HEENT:  No headaches, Difficulty swallowing,Tooth/dental problems,Sore throat,  No sneezing, itching, ear ache, nasal congestion, post nasal drip,  Cardio-vascular:  No chest pain, Orthopnea, PND, swelling in lower extremities, anasarca, dizziness, palpitations  GI:  No heartburn, indigestion, abdominal pain, nausea, vomiting, diarrhea, change in bowel habits, loss of appetite  Resp:  No wheezing.No chest wall deformity  Skin:  no rash or lesions.  GU:  no dysuria, change in  color of urine, no urgency or frequency. No flank pain.  Musculoskeletal:  No joint pain or swelling. No decreased range of motion. No back pain.  Psych:  No change in mood or affect. No depression or anxiety. No memory loss.    MSE: Patient appeared sitting on the edge of bed and trying to eat his lunch tray but does complained about the quality of food. He has irritable and upset mood and affect. He is cooperative with the interview. He has appropriate and reactive affect. He has normal speech but speaks with irritability and somewhat loud. He has normal thought process and denied SI/HI and no evidence of psychosis. He has fair to poor insight, judgment and impulses.   Past Medical History  Diagnosis Date  . Hypertension   . OSA (obstructive sleep apnea)   . Chronic kidney disease     with proteinuria  . Hyperlipidemia   . Type 2 diabetes mellitus   . COPD (chronic obstructive pulmonary disease)     with exacerbation in the past  . Chronic diastolic heart failure     Past Surgical History  Procedure Laterality Date  . Cardiac catheterization  10/05/2011     normal filling pressures with a wedge of 10 and a PAS of  42.  In the coronary arteriogram, the LAD had a 20% proximal stenosisand the RCA had a 30% mid stenosis with no other disease noted    . Back surgery    . Kidney stone surgery    . Appendectomy    . Hernia repair      Family History  Problem Relation Age  of Onset  . Bronchitis Mother   . Colon cancer Father     Social History:  reports that he quit smoking about 2 years ago. His smoking use included Cigarettes. He has a 130 pack-year smoking history. He has never used smokeless tobacco. He reports that he does not drink alcohol. His drug history is not on file.  Allergies:  Allergies  Allergen Reactions  . Ambien [Zolpidem] Other (See Comments)    "becomes violent"  . Bupropion Hcl     REACTION: shaking    Medications: I have reviewed the patient's current  medications.  Results for orders placed during the hospital encounter of 03/16/14 (from the past 48 hour(s))  CBG MONITORING, ED     Status: Abnormal   Collection Time    03/16/14 12:48 PM      Result Value Ref Range   Glucose-Capillary 157 (*) 70 - 99 mg/dL  CBC WITH DIFFERENTIAL     Status: Abnormal   Collection Time    03/16/14  1:09 PM      Result Value Ref Range   WBC 6.7  4.0 - 10.5 K/uL   RBC 3.48 (*) 4.22 - 5.81 MIL/uL   Hemoglobin 10.9 (*) 13.0 - 17.0 g/dL   HCT 31.7 (*) 39.0 - 52.0 %   MCV 91.1  78.0 - 100.0 fL   MCH 31.3  26.0 - 34.0 pg   MCHC 34.4  30.0 - 36.0 g/dL   RDW 14.0  11.5 - 15.5 %   Platelets 169  150 - 400 K/uL   Neutrophils Relative % 70  43 - 77 %   Neutro Abs 4.7  1.7 - 7.7 K/uL   Lymphocytes Relative 8 (*) 12 - 46 %   Lymphs Abs 0.5 (*) 0.7 - 4.0 K/uL   Monocytes Relative 22 (*) 3 - 12 %   Monocytes Absolute 1.5 (*) 0.1 - 1.0 K/uL   Eosinophils Relative 0  0 - 5 %   Eosinophils Absolute 0.0  0.0 - 0.7 K/uL   Basophils Relative 0  0 - 1 %   Basophils Absolute 0.0  0.0 - 0.1 K/uL  COMPREHENSIVE METABOLIC PANEL     Status: Abnormal   Collection Time    03/16/14  1:09 PM      Result Value Ref Range   Sodium 135 (*) 137 - 147 mEq/L   Potassium 2.9 (*) 3.7 - 5.3 mEq/L   Comment: CRITICAL RESULT CALLED TO, READ BACK BY AND VERIFIED WITH:     ADKINS C,RN 1410 03/16/14 SCALES H   Chloride 95 (*) 96 - 112 mEq/L   CO2 26  19 - 32 mEq/L   Glucose, Bld 105 (*) 70 - 99 mg/dL   BUN 50 (*) 6 - 23 mg/dL   Creatinine, Ser 2.07 (*) 0.50 - 1.35 mg/dL   Calcium 7.9 (*) 8.4 - 10.5 mg/dL   Total Protein 6.4  6.0 - 8.3 g/dL   Albumin 2.5 (*) 3.5 - 5.2 g/dL   AST 22  0 - 37 U/L   ALT 33  0 - 53 U/L   Alkaline Phosphatase 98  39 - 117 U/L   Total Bilirubin 0.8  0.3 - 1.2 mg/dL   GFR calc non Af Amer 30 (*) >90 mL/min   GFR calc Af Amer 34 (*) >90 mL/min   Comment: (NOTE)     The eGFR has been calculated using the CKD EPI equation.     This calculation has not  been validated in all clinical situations.     eGFR's persistently <90 mL/min signify possible Chronic Kidney     Disease.  PRO B NATRIURETIC PEPTIDE     Status: Abnormal   Collection Time    03/16/14  1:09 PM      Result Value Ref Range   Pro B Natriuretic peptide (BNP) 3251.0 (*) 0 - 450 pg/mL  I-STAT TROPOININ, ED     Status: None   Collection Time    03/16/14  1:30 PM      Result Value Ref Range   Troponin i, poc 0.01  0.00 - 0.08 ng/mL   Comment 3            Comment: Due to the release kinetics of cTnI,     a negative result within the first hours     of the onset of symptoms does not rule out     myocardial infarction with certainty.     If myocardial infarction is still suspected,     repeat the test at appropriate intervals.  URINALYSIS, ROUTINE W REFLEX MICROSCOPIC     Status: Abnormal   Collection Time    03/16/14  4:10 PM      Result Value Ref Range   Color, Urine YELLOW  YELLOW   APPearance CLEAR  CLEAR   Specific Gravity, Urine 1.013  1.005 - 1.030   pH 5.5  5.0 - 8.0   Glucose, UA 250 (*) NEGATIVE mg/dL   Hgb urine dipstick NEGATIVE  NEGATIVE   Bilirubin Urine NEGATIVE  NEGATIVE   Ketones, ur NEGATIVE  NEGATIVE mg/dL   Protein, ur 100 (*) NEGATIVE mg/dL   Urobilinogen, UA 1.0  0.0 - 1.0 mg/dL   Nitrite NEGATIVE  NEGATIVE   Leukocytes, UA NEGATIVE  NEGATIVE  URINE MICROSCOPIC-ADD ON     Status: Abnormal   Collection Time    03/16/14  4:10 PM      Result Value Ref Range   Squamous Epithelial / LPF FEW (*) RARE   WBC, UA 0-2  <3 WBC/hpf  CULTURE, BLOOD (ROUTINE X 2)     Status: None   Collection Time    03/16/14  4:12 PM      Result Value Ref Range   Specimen Description BLOOD RIGHT ARM     Special Requests BOTTLES DRAWN AEROBIC AND ANAEROBIC 5CC EACH     Culture  Setup Time       Value: 03/16/2014 22:46     Performed at [facility]   Culture       Value:        BLOOD CULTURE RECEIVED NO GROWTH TO DATE CULTURE WILL BE HELD FOR 5 DAYS BEFORE  ISSUING A FINAL NEGATIVE REPORT     Performed at [facility]   Report Status PENDING    CULTURE, BLOOD (ROUTINE X 2)     Status: None   Collection Time    03/16/14  4:24 PM      Result Value Ref Range   Specimen Description BLOOD RIGHT HAND     Special Requests BOTTLES DRAWN AEROBIC AND ANAEROBIC 5CC     Culture  Setup Time       Value: 03/16/2014 22:46     Performed at [facility]   Culture       Value:        BLOOD CULTURE RECEIVED NO GROWTH TO DATE CULTURE WILL BE HELD FOR 5 DAYS BEFORE ISSUING A FINAL NEGATIVE  REPORT     Performed at [facility]   Report Status PENDING    I-STAT CG4 LACTIC ACID, ED     Status: None   Collection Time    03/16/14  4:35 PM      Result Value Ref Range   Lactic Acid, Venous 1.00  0.5 - 2.2 mmol/L  STREP PNEUMONIAE URINARY ANTIGEN     Status: None   Collection Time    03/16/14  5:34 PM      Result Value Ref Range   Strep Pneumo Urinary Antigen NEGATIVE  NEGATIVE   Comment:            Infection due to S. pneumoniae     cannot be absolutely ruled out     since the antigen present     may be below the detection limit     of the test.  SODIUM, URINE, RANDOM     Status: None   Collection Time    03/16/14  5:34 PM      Result Value Ref Range   Sodium, Ur 90    CREATININE, URINE, RANDOM     Status: None   Collection Time    03/16/14  5:34 PM      Result Value Ref Range   Creatinine, Urine 30.03    MRSA PCR SCREENING     Status: None   Collection Time    03/16/14  5:34 PM      Result Value Ref Range   MRSA by PCR NEGATIVE  NEGATIVE   Comment:            The GeneXpert MRSA Assay (FDA     approved for NASAL specimens     only), is one component of a     comprehensive MRSA colonization     surveillance program. It is not     intended to diagnose MRSA     infection nor to guide or     monitor treatment for     MRSA infections.  GLUCOSE, CAPILLARY     Status: None   Collection Time    03/16/14  5:53 PM       Result Value Ref Range   Glucose-Capillary 96  70 - 99 mg/dL  GLUCOSE, CAPILLARY     Status: Abnormal   Collection Time    03/17/14 12:29 AM      Result Value Ref Range   Glucose-Capillary 200 (*) 70 - 99 mg/dL   Comment 1 Documented in Chart     Comment 2 Notify RN    CULTURE, EXPECTORATED SPUTUM-ASSESSMENT     Status: None   Collection Time    03/17/14  5:35 AM      Result Value Ref Range   Specimen Description SPUTUM     Special Requests NONE     Sputum evaluation       Value: THIS SPECIMEN IS ACCEPTABLE. RESPIRATORY CULTURE REPORT TO FOLLOW.   Report Status 03/17/2014 FINAL    COMPREHENSIVE METABOLIC PANEL     Status: Abnormal   Collection Time    03/17/14  5:49 AM      Result Value Ref Range   Sodium 138  137 - 147 mEq/L   Potassium 3.2 (*) 3.7 - 5.3 mEq/L   Chloride 98  96 - 112 mEq/L   CO2 23  19 - 32 mEq/L   Glucose, Bld 207 (*) 70 - 99 mg/dL   BUN 44 (*) 6 - 23 mg/dL  Creatinine, Ser 1.97 (*) 0.50 - 1.35 mg/dL   Calcium 7.3 (*) 8.4 - 10.5 mg/dL   Total Protein 5.4 (*) 6.0 - 8.3 g/dL   Albumin 2.3 (*) 3.5 - 5.2 g/dL   AST 28  0 - 37 U/L   ALT 33  0 - 53 U/L   Alkaline Phosphatase 101  39 - 117 U/L   Total Bilirubin 0.9  0.3 - 1.2 mg/dL   GFR calc non Af Amer 31 (*) >90 mL/min   GFR calc Af Amer 37 (*) >90 mL/min   Comment: (NOTE)     The eGFR has been calculated using the CKD EPI equation.     This calculation has not been validated in all clinical situations.     eGFR's persistently <90 mL/min signify possible Chronic Kidney     Disease.  CBC     Status: Abnormal   Collection Time    03/17/14  5:49 AM      Result Value Ref Range   WBC 5.7  4.0 - 10.5 K/uL   RBC 3.31 (*) 4.22 - 5.81 MIL/uL   Hemoglobin 10.3 (*) 13.0 - 17.0 g/dL   HCT 30.0 (*) 39.0 - 52.0 %   MCV 90.6  78.0 - 100.0 fL   MCH 31.1  26.0 - 34.0 pg   MCHC 34.3  30.0 - 36.0 g/dL   RDW 14.1  11.5 - 15.5 %   Platelets 189  150 - 400 K/uL  MAGNESIUM     Status: None   Collection Time     03/17/14  5:49 AM      Result Value Ref Range   Magnesium 2.0  1.5 - 2.5 mg/dL  TSH     Status: None   Collection Time    03/17/14  5:49 AM      Result Value Ref Range   TSH 2.050  0.350 - 4.500 uIU/mL   Comment: Please note change in reference range.  GLUCOSE, CAPILLARY     Status: Abnormal   Collection Time    03/17/14  7:51 AM      Result Value Ref Range   Glucose-Capillary 200 (*) 70 - 99 mg/dL  LACTATE DEHYDROGENASE     Status: None   Collection Time    03/17/14 10:45 AM      Result Value Ref Range   LDH 236  94 - 250 U/L  GLUCOSE, CAPILLARY     Status: Abnormal   Collection Time    03/17/14 12:00 PM      Result Value Ref Range   Glucose-Capillary 218 (*) 70 - 99 mg/dL    Dg Chest 2 View  03/16/2014   CLINICAL DATA:  Cough.  Hemoptysis.  EXAM: CHEST  2 VIEW  COMPARISON:  10/31/2008  FINDINGS: The cardiac silhouette, mediastinal and hilar contours are within normal limits and stable. There is tortuosity and calcification of the thoracic aorta. There are suspected underlying emphysematous changes with superimposed bilateral infiltrates. Small bilateral pleural effusions are noted. The bony thorax is intact.  IMPRESSION: Suspect underlying emphysematous changes.  Bilateral lung infiltrates.  Small bilateral pleural effusions.   Electronically Signed   By: Kalman Jewels M.D.   On: 03/16/2014 15:27   Ct Chest Wo Contrast  03/16/2014   CLINICAL DATA:  Hemoptysis.  EXAM: CT CHEST WITHOUT CONTRAST  TECHNIQUE: Multidetector CT imaging of the chest was performed following the standard protocol without IV contrast.  COMPARISON:  DG CHEST 2 VIEW dated 03/16/2014  FINDINGS:  Atherosclerotic vascular calcification noted of the thoracic aorta. Aorta normal caliber. Heart size normal. Coronary artery disease.  Multiple mediastinal and paratracheal lymph nodes noted. Largest lymph node is retrocaval pretracheal, image number 22. This measures 1.4 cm. Thoracic esophagus unremarkable.  Large airways are  patent. Prominent patchy pulmonary infiltrates noted throughout the right lung most consistent with pneumonia. Associated right-sided pleural effusion is present. Small left pleural effusion cannot be excluded. Mild basilar atelectasis noted on the left. No pneumothorax.  Adrenals are normal.  Gallstone noted.  Degenerative changes thoracic spine. No acute bony abnormality identified. Asymmetric soft tissue density left breast. Mammography is suggested to exclude a breast mass.  IMPRESSION: 1. Prominent pulmonary infiltrates noted throughout the right lung with moderate size right pleural effusion. 2. Mild left basilar atelectasis and small pleural effusion on the left. 3. Asymmetric soft tissue density of the left breast. Although this patient is a male, mammography is suggested as a left breast malignancy cannot be excluded. 4. Coronary artery disease. 5. Multiple small mediastinal lymph nodes go largest measures 1.4 cm in the retrocaval pretracheal region. These are nonspecific.   Electronically Signed   By: Marcello Moores  Register   On: 03/16/2014 17:20    Positive for bad mood, depression, mood swings, sleep disturbance and irritability Blood pressure 154/64, pulse 74, temperature 98.2 F (36.8 C), temperature source Oral, resp. rate 20, height 6' (1.829 m), weight 106 kg (233 lb 11 oz), SpO2 94.00%.   Assessment/Plan: Major depressive disorder, recurrent  Recommendation: Patient does not meet criteria for acute psych hospitalization as he has no safety concers May refer to out patient psych treatment when medically cleared Discontinue Cymbalta due to increased irritability and agitation Increase Celexa 60 mg PO Qam for better control of depression Increase 100 mg PO Qhs for insomnia Appreciate psych consult and follow up as clinically required May contact [phone] if needs further assistance  Robert Sunga,JANARDHAHA R. 03/17/2014, 1:05 PM

## 2014-03-17 NOTE — Progress Notes (Signed)
Placed patient on CPAP for the night via auto-mode with minimum pressure set at 6cm and maximum pressure set at 20cm. Oxygen set at 3 lpm with Sp02 level at 94%

## 2014-03-17 NOTE — Progress Notes (Addendum)
Pharmacy received an order for Celexa 60mg  daily per psychiatry. Based on an FDA warning, 20mg /day is the maximum recommended dose in patients >76 years of age. I called Dr. Elsie SaasJonnalagadda and told him about the FDA warning and the risk for QTc prolongation (QTc on 4/4 476). Patient's dose of trazodone was also increased which can further prolong the QTc in combination with Celexa. Plan is to continue with Celexa 60mg  daily.  Christiane HaJonathan A. Lenon AhmadiBinz, PharmD Clinical Pharmacist - Resident Pager: [phone] Pharmacy: [phone] 03/17/2014 3:05 PM

## 2014-03-17 NOTE — Progress Notes (Signed)
Pt wanted to use the bathroom, was very insistent and stated "I am not sitting on that bedpan and I wanted to go to the bathroom", despite being told by writer of this note that he is on bedrest. Pt showed impulsiveness and lacked safety judgement. Will continue to monitor.

## 2014-03-17 NOTE — Progress Notes (Addendum)
[facility] PROGRESS NOTE  Douglas [name] ZOX:[id number] DOB: 06/25/1936 DOA: 03/16/2014 PCP: Kathlee Nations, MD   Brief narrative  79 y/o male with hx of diastolic CHF, HTN, DM, CAD, COPD and OSA on BiPAP , CKD ( unknown baseline) who [facility] after being admitted for N,V and diarrhea,  presented with one day hx of SOB with hemoptysis. patient reports progressive SOB for past  2 weeks with productive cough and subjective fever. patient afebrile with normal wbc on presentative.   CXR showed b/l lung infiltrates for which  a CT chest w/o contrast was done which again showed right sided infiltrate with moderate effusion.   Assessment/Plan:  Healthcare assocaited pneumonia  monitor on tele. patient requiring almost 5 L o2 via [place]. continue IV vanco and cefepime. Narrow coverage based on clinical improvement and cx results.  Hemoptysis possibly related to pneumonia and now improved. Follow blood cx, sputum cx. Urine strep ag and legionella ag negative. deneis hx TB or sick contast,. Reports losing about 6-7 lbs in 2 weeks.  Has moderate right pleural effusion on CT scan. Will get diagnostic  and therapeutic thoracentesis. Send fluid for cx and cytology given hx of heavy smoking.. Supportive care with fluids, nebs, antitussives and tylenol  DM continue home dose novolin  and SSI. A1C pending  CKD  baseline unknown presented with cr of 2. Holding diuretics and gentle hydration.   COPD  continue advair, spiriva. Added prn albuterol nebs  HTN  continue amlodipine  Hypokalemia  replenish  Anemia  possibly of chr disease. Will check anemia panel.   Protein calorie malnutrition  nutrition consult   Hx of diastolic CHF and CAD Appears euvolemic although has elevated pro BNP. Check 2 D echo Continue ASA, lipitor  Left breast soft tissue density  noted on CT scan. His left breast does appear larger than right. Needs mammogram as outpt  OSA   continue BiPAP at night  Major depression  seen by psych . recommends to disontinue cymbalta and increase dose fo celexa to 60 mg in am and trazodone to 100 mg qhs.   Diet: diabetic  DVT prophylaxis: sq heparin  Code Status: full code Family Communication: daughter jennifer at bedside Disposition Plan: home once improved   Consultants:  none  Procedures:  None. For IR guided rt thoracentesis  Antibiotics:  IV vanco and cefepimeme ( 4/4>>)  HPI/Subjective: Patient seen and examined this am. Reports SOB to be slightly better. Afebrile. Unhappy about safety precaution and not getting things requested by him at bedside.  Objective: Filed Vitals:   03/17/14 0848  BP: 154/64  Pulse: 74  Temp:   Resp:     Intake/Output Summary (Last 24 hours) at 03/17/14 1334 Last data filed at 03/17/14 0851  Gross per 24 hour  Intake    222 ml  Output   1200 ml  Net   -978 ml   Filed Weights   03/16/14 1731 03/17/14 0441  Weight: 106.686 kg (235 lb 3.2 oz) 106 kg (233 lb 11 oz)    Exam:   General:  Elderly obese male in NAD  HEENT: no pallor, moist oral mucosa, no cervical or axillar LN  Chest: diminished breath sounds over right lung, no crackles, scattered rhonchi, Left breast> right  Cardiovascular: N S1&S2, no murmurs, rubs or gallop  Abdomen: soft, NT, ND, BS+  Musculoskeletal: warm, no edema   CNS: AAOX3   Data Reviewed: Basic Metabolic Panel:  Recent Labs  Lab 03/16/14 1309 03/17/14 0549  NA 135* 138  K 2.9* 3.2*  CL 95* 98  CO2 26 23  GLUCOSE 105* 207*  BUN 50* 44*  CREATININE 2.07* 1.97*  CALCIUM 7.9* 7.3*  MG  --  2.0   Liver Function Tests:  Recent Labs Lab 03/16/14 1309 03/17/14 0549  AST 22 28  ALT 33 33  ALKPHOS 98 101  BILITOT 0.8 0.9  PROT 6.4 5.4*  ALBUMIN 2.5* 2.3*   No results found for this basename: LIPASE, AMYLASE,  in the last 168 hours No results found for this basename: AMMONIA,  in the last 168  hours CBC:  Recent Labs Lab 03/16/14 1309 03/17/14 0549  WBC 6.7 5.7  NEUTROABS 4.7  --   HGB 10.9* 10.3*  HCT 31.7* 30.0*  MCV 91.1 90.6  PLT 169 189   Cardiac Enzymes: No results found for this basename: CKTOTAL, CKMB, CKMBINDEX, TROPONINI,  in the last 168 hours BNP (last 3 results)  Recent Labs  03/16/14 1309  PROBNP 3251.0*   CBG:  Recent Labs Lab 03/16/14 1248 03/16/14 1753 03/17/14 0029 03/17/14 0751 03/17/14 1200  GLUCAP 157* 96 200* 200* 218*    Recent Results (from the past 240 hour(s))  CULTURE, BLOOD (ROUTINE X 2)     Status: None   Collection Time    03/16/14  4:12 PM      Result Value Ref Range Status   Specimen Description BLOOD RIGHT ARM   Final   Special Requests BOTTLES DRAWN AEROBIC AND ANAEROBIC 5CC EACH   Final   Culture  Setup Time     Final   Value: 03/16/2014 22:46     Performed at [facility]   Culture     Final   Value:        BLOOD CULTURE RECEIVED NO GROWTH TO DATE CULTURE WILL BE HELD FOR 5 DAYS BEFORE ISSUING A FINAL NEGATIVE REPORT     Performed at [facility]   Report Status PENDING   Incomplete  CULTURE, BLOOD (ROUTINE X 2)     Status: None   Collection Time    03/16/14  4:24 PM      Result Value Ref Range Status   Specimen Description BLOOD RIGHT HAND   Final   Special Requests BOTTLES DRAWN AEROBIC AND ANAEROBIC 5CC   Final   Culture  Setup Time     Final   Value: 03/16/2014 22:46     Performed at [facility]   Culture     Final   Value:        BLOOD CULTURE RECEIVED NO GROWTH TO DATE CULTURE WILL BE HELD FOR 5 DAYS BEFORE ISSUING A FINAL NEGATIVE REPORT     Performed at [facility]   Report Status PENDING   Incomplete  MRSA PCR SCREENING     Status: None   Collection Time    03/16/14  5:34 PM      Result Value Ref Range Status   MRSA by PCR NEGATIVE  NEGATIVE Final   Comment:            The GeneXpert MRSA Assay (FDA     approved for NASAL specimens     only), is one component  of a     comprehensive MRSA colonization     surveillance program. It is not     intended to diagnose MRSA     infection nor to guide or  monitor treatment for     MRSA infections.  CULTURE, EXPECTORATED SPUTUM-ASSESSMENT     Status: None   Collection Time    03/17/14  5:35 AM      Result Value Ref Range Status   Specimen Description SPUTUM   Final   Special Requests NONE   Final   Sputum evaluation     Final   Value: THIS SPECIMEN IS ACCEPTABLE. RESPIRATORY CULTURE REPORT TO FOLLOW.   Report Status 03/17/2014 FINAL   Final     Studies: Dg Chest 2 View  03/16/2014   CLINICAL DATA:  Cough.  Hemoptysis.  EXAM: CHEST  2 VIEW  COMPARISON:  10/31/2008  FINDINGS: The cardiac silhouette, mediastinal and hilar contours are within normal limits and stable. There is tortuosity and calcification of the thoracic aorta. There are suspected underlying emphysematous changes with superimposed bilateral infiltrates. Small bilateral pleural effusions are noted. The bony thorax is intact.  IMPRESSION: Suspect underlying emphysematous changes.  Bilateral lung infiltrates.  Small bilateral pleural effusions.   Electronically Signed   By: Loralie ChampagneMark  Gallerani M.D.   On: 03/16/2014 15:27   Ct Chest Wo Contrast  03/16/2014   CLINICAL DATA:  Hemoptysis.  EXAM: CT CHEST WITHOUT CONTRAST  TECHNIQUE: Multidetector CT imaging of the chest was performed following the standard protocol without IV contrast.  COMPARISON:  DG CHEST 2 VIEW dated 03/16/2014  FINDINGS: Atherosclerotic vascular calcification noted of the thoracic aorta. Aorta normal caliber. Heart size normal. Coronary artery disease.  Multiple mediastinal and paratracheal lymph nodes noted. Largest lymph node is retrocaval pretracheal, image number 22. This measures 1.4 cm. Thoracic esophagus unremarkable.  Large airways are patent. Prominent patchy pulmonary infiltrates noted throughout the right lung most consistent with pneumonia. Associated right-sided pleural  effusion is present. Small left pleural effusion cannot be excluded. Mild basilar atelectasis noted on the left. No pneumothorax.  Adrenals are normal.  Gallstone noted.  Degenerative changes thoracic spine. No acute bony abnormality identified. Asymmetric soft tissue density left breast. Mammography is suggested to exclude a breast mass.  IMPRESSION: 1. Prominent pulmonary infiltrates noted throughout the right lung with moderate size right pleural effusion. 2. Mild left basilar atelectasis and small pleural effusion on the left. 3. Asymmetric soft tissue density of the left breast. Although this patient is a male, mammography is suggested as a left breast malignancy cannot be excluded. 4. Coronary artery disease. 5. Multiple small mediastinal lymph nodes go largest measures 1.4 cm in the retrocaval pretracheal region. These are nonspecific.   Electronically Signed   By: Maisie Fushomas  Register   On: 03/16/2014 17:20    Scheduled Meds: . albuterol  2.5 mg Nebulization Q4H  . allopurinol  100 mg Oral q morning - 10a  . amLODipine  10 mg Oral Daily  . antiseptic oral rinse  15 mL Mouth Rinse q12n4p  . aspirin EC  81 mg Oral Daily  . atorvastatin  20 mg Oral q1800  . calcitRIOL  0.25 mcg Oral Daily  . [START ON 03/18/2014] ceFEPime (MAXIPIME) IV  2 g Intravenous Q24H  . chlorhexidine  15 mL Mouth/Throat BID  . citalopram  40 mg Oral Daily  . DULoxetine  30 mg Oral Daily  . heparin  5,000 Units Subcutaneous 3 times per day  . hydrALAZINE  25 mg Oral TID  . insulin aspart  0-5 Units Subcutaneous QHS  . insulin aspart  0-9 Units Subcutaneous TID WC  . insulin NPH Human  30 Units Subcutaneous BID AC  .  mometasone-formoterol  2 puff Inhalation BID  . omega-3 acid ethyl esters  1 g Oral BID  . potassium chloride SA  20 mEq Oral BID  . propranolol  10 mg Oral BID  . sodium chloride  3 mL Intravenous Q12H  . tamsulosin  0.4 mg Oral Daily  . tiotropium  18 mcg Inhalation Daily  . traZODone  50 mg Oral QHS  .  vancomycin  1,500 mg Intravenous Q24H  . [START ON 03/21/2014] Vitamin D (Ergocalciferol)  50,000 Units Oral Q Thu   Continuous Infusions:     Time spent: 25 minutes    Lynel Forester  [facility] Pager [phone]. If 7PM-7AM, please contact night-coverage at [email or web address], password [facility] 03/17/2014, 1:34 PM  LOS: 1 day

## 2014-03-18 ENCOUNTER — Inpatient Hospital Stay (HOSPITAL_COMMUNITY): Payer: Medicare Other

## 2014-03-18 DIAGNOSIS — E875 Hyperkalemia: Secondary | ICD-10-CM

## 2014-03-18 DIAGNOSIS — I517 Cardiomegaly: Secondary | ICD-10-CM

## 2014-03-18 LAB — PROTEIN, BODY FLUID: TOTAL PROTEIN, FLUID: 1.7 g/dL

## 2014-03-18 LAB — GLUCOSE, CAPILLARY
Glucose-Capillary: 100 mg/dL — ABNORMAL HIGH (ref 70–99)
Glucose-Capillary: 142 mg/dL — ABNORMAL HIGH (ref 70–99)
Glucose-Capillary: 236 mg/dL — ABNORMAL HIGH (ref 70–99)
Glucose-Capillary: 233 mg/dL — ABNORMAL HIGH (ref 70–99)

## 2014-03-18 LAB — LACTATE DEHYDROGENASE, PLEURAL OR PERITONEAL FLUID: LD FL: 94 U/L — AB (ref 3–23)

## 2014-03-18 LAB — URINE CULTURE: Colony Count: 35000

## 2014-03-18 LAB — BODY FLUID CELL COUNT WITH DIFFERENTIAL
EOS FL: 0 %
Lymphs, Fluid: 4 %
Monocyte-Macrophage-Serous Fluid: 84 % (ref 50–90)
Neutrophil Count, Fluid: 12 % (ref 0–25)
WBC FLUID: 475 uL (ref 0–1000)

## 2014-03-18 LAB — BASIC METABOLIC PANEL
BUN: 41 mg/dL — ABNORMAL HIGH (ref 6–23)
CHLORIDE: 102 meq/L (ref 96–112)
CO2: 25 meq/L (ref 19–32)
Calcium: 8.1 mg/dL — ABNORMAL LOW (ref 8.4–10.5)
Creatinine, Ser: 1.76 mg/dL — ABNORMAL HIGH (ref 0.50–1.35)
GFR calc non Af Amer: 36 mL/min — ABNORMAL LOW (ref >90)
GFR, EST AFRICAN AMERICAN: 42 mL/min — AB (ref >90)
Glucose, Bld: 84 mg/dL (ref 70–99)
POTASSIUM: 3.6 meq/L — AB (ref 3.7–5.3)
Sodium: 141 mEq/L (ref 137–147)

## 2014-03-18 LAB — IRON AND TIBC
IRON: 29 ug/dL — AB (ref 42–135)
SATURATION RATIOS: 13 % — AB (ref 20–55)
TIBC: 219 ug/dL (ref 215–435)
UIBC: 190 ug/dL (ref 125–400)

## 2014-03-18 LAB — VITAMIN B12: VITAMIN B 12: 293 pg/mL (ref 211–911)

## 2014-03-18 MED ORDER — INSULIN NPH (HUMAN) (ISOPHANE) 100 UNIT/ML ~~LOC~~ SUSP
35.0000 [IU] | Freq: Two times a day (BID) | SUBCUTANEOUS | Status: DC
  Administered 2014-03-18 – 2014-03-19 (×2): 35 [IU] via SUBCUTANEOUS

## 2014-03-18 MED ORDER — DIPHENHYDRAMINE HCL 25 MG PO CAPS
25.0000 mg | ORAL_CAPSULE | Freq: Once | ORAL | Status: AC | PRN
  Administered 2014-03-19: 25 mg via ORAL
  Filled 2014-03-18: qty 1

## 2014-03-18 NOTE — Procedures (Signed)
Small pleural effusion noted on right Successful US guided right thoracentesis. Yielded 380mL of clear yellow fluid. Pt tolerated procedure well. No immediate complications.  Specimen was sent for labs. CXR ordered.  Brayton ElBRUNING, Martavia Tye PA-C 03/18/2014 10:12 AM

## 2014-03-18 NOTE — Progress Notes (Signed)
Nutrition Brief Note  Patient identified on the Malnutrition Screening Tool (MST) Report. Pt reports that his usual body weight is 235 and has weighed that for several months. Eats all meals at dining room at [facility].  Wt Readings from Last 15 Encounters:  03/18/14 234 lb 2.1 oz (106.2 kg)  10/21/11 231 lb (104.781 kg)    Body mass index is 31.75 kg/(m^2). Patient meets criteria for Obese Class I based on current BMI.   Current diet order is Carbohydrate Modified Medium. Labs and medications reviewed.   No nutrition interventions warranted at this time. If nutrition issues arise, please consult RD.   Jarold MottoSamantha Qusay Villada MS, RD, LDN Inpatient Registered Dietitian Pager: [phone] After-hours pager: [phone]

## 2014-03-18 NOTE — Clinical Social Work Psych Note (Signed)
Psychiatrist evaluated pt on 03/17/2014 (Dr. Elsie SaasJonnalagadda).  Medications were reviewed and adjustments recommended.  No additional psych needs identified at this time.  Please contact psych CSW with additional psych needs.  Psych CSW remains available with assistance.  Vickii PennaGina Ziv Welchel, LCSWA [phone]  Clinical Social Work

## 2014-03-18 NOTE — Progress Notes (Addendum)
[facility] PROGRESS NOTE  Douglas [name] ZOX:[id number] DOB: [birth year] DOA: 03/16/2014 PCP: Kathlee Nations, MD   Brief narrative  78 y/o male with hx of diastolic CHF, HTN, DM, CAD, COPD and OSA on BiPAP , CKD ( baseline 1.6-1.8) who [facility] after being admitted for N,V and diarrhea,  presented with one day hx of SOB with hemoptysis. patient reports progressive SOB for past  2 weeks with productive cough and subjective fever. patient afebrile with normal wbc on presentative.   CXR showed b/l lung infiltrates for which  a CT chest w/o contrast was done which again showed right sided infiltrate with moderate effusion.   Assessment/Plan:  Healthcare assocaited pneumonia patient maintains o2 sat on 2L. continue IV vanco and cefepime. Narrow coverage based on clinical improvement and cx results.  Hemoptysis possibly related to pneumonia and now improved. Follow blood cx, sputum cx. Urine strep ag and legionella ag negative. deneis hx TB or sick contast,. Reports losing about 6-7 lbs in 2 weeks.  Has moderate right pleural effusion on CT scan. Underwent IR guided thoracentesis with 280 cc fluid removed. Appears to be exudative and likely para pneumonic. Send fluid for cx and cytology given hx of heavy smoking.. Supportive care with  nebs, antitussives and tylenol  DM continue home dose novolin and SSI. A1C 9.2  CKD  baseline from 2012 between 1.6-1.8.  Renal fn improved after Holding diuretics and gentle hydration.   COPD  continue advair, spiriva. Added prn albuterol nebs  HTN  continue amlodipine And hydralazine  Hypokalemia  replenish  Anemia irone def noted on irn panel. Check stool for occult blood. Will add supplements prior to discharge.   Protein calorie malnutrition  nutrition consult   Hx of diastolic CHF and CAD Appears euvolemic although has elevated pro BNP. Check 2 D echo Continue ASA, lipitor  Left breast soft tissue  density  noted on CT scan. His left breast does appear larger than right. Needs mammogram as outpt  OSA  continue BiPAP at night  Major depression  seen by psych . recommends to disontinue cymbalta and increase dose fo celexa to 60 mg in am and trazodone to 100 mg qhs.   Diet: diabetic  DVT prophylaxis: sq heparin  Code Status: full code Family Communication: none at bedside. discussed with daughter on 4/5 Disposition Plan: home once improved likely in 1-2 days   Consultants:  none  Procedures:  None. For IR guided rt thoracentesis  Antibiotics:  IV vanco and cefepimeme ( 4/4>>)  HPI/Subjective: Patient seen and examined this am. Reports SOB to be slightly better. Underwent right thoracentesis today with removal of 280 cc fluids  Objective: Filed Vitals:   03/18/14 1109  BP: 174/71  Pulse: 82  Temp:   Resp:     Intake/Output Summary (Last 24 hours) at 03/18/14 1330 Last data filed at 03/18/14 1100  Gross per 24 hour  Intake    590 ml  Output   2400 ml  Net  -1810 ml   Filed Weights   03/16/14 1731 03/17/14 0441 03/18/14 0500  Weight: 106.686 kg (235 lb 3.2 oz) 106 kg (233 lb 11 oz) 106.2 kg (234 lb 2.1 oz)    Exam:   General:  Elderly obese male in NAD  HEENT: no pallor, moist oral mucosa,   Chest: clear breath sounds b/l, Left breast> right  Cardiovascular: N S1&S2, no murmurs, rubs or gallop  Abdomen: soft, NT, ND, BS+  Musculoskeletal:  warm, no edema   CNS: AAOX3   Data Reviewed: Basic Metabolic Panel:  Recent Labs Lab 03/16/14 1309 03/17/14 0549 03/18/14 0658  NA 135* 138 141  K 2.9* 3.2* 3.6*  CL 95* 98 102  CO2 26 23 25   GLUCOSE 105* 207* 84  BUN 50* 44* 41*  CREATININE 2.07* 1.97* 1.76*  CALCIUM 7.9* 7.3* 8.1*  MG  --  2.0  --    Liver Function Tests:  Recent Labs Lab 03/16/14 1309 03/17/14 0549  AST 22 28  ALT 33 33  ALKPHOS 98 101  BILITOT 0.8 0.9  PROT 6.4 5.4*  ALBUMIN 2.5* 2.3*   No results found for  this basename: LIPASE, AMYLASE,  in the last 168 hours No results found for this basename: AMMONIA,  in the last 168 hours CBC:  Recent Labs Lab 03/16/14 1309 03/17/14 0549  WBC 6.7 5.7  NEUTROABS 4.7  --   HGB 10.9* 10.3*  HCT 31.7* 30.0*  MCV 91.1 90.6  PLT 169 189   Cardiac Enzymes: No results found for this basename: CKTOTAL, CKMB, CKMBINDEX, TROPONINI,  in the last 168 hours BNP (last 3 results)  Recent Labs  03/16/14 1309  PROBNP 3251.0*   CBG:  Recent Labs Lab 03/17/14 1200 03/17/14 1726 03/17/14 2118 03/18/14 0805 03/18/14 1202  GLUCAP 218* 210* 120* 100* 142*    Recent Results (from the past 240 hour(s))  URINE CULTURE     Status: None   Collection Time    03/16/14  4:10 PM      Result Value Ref Range Status   Specimen Description URINE, CLEAN CATCH   Final   Special Requests NONE   Final   Culture  Setup Time     Final   Value: 03/16/2014 21:56     Performed at [facility] Count     Final   Value: 35,000 COLONIES/ML     Performed at [facility]   Culture     Final   Value: Multiple bacterial morphotypes present, none predominant. Suggest appropriate recollection if clinically indicated.     Performed at [facility]   Report Status 03/18/2014 FINAL   Final  CULTURE, BLOOD (ROUTINE X 2)     Status: None   Collection Time    03/16/14  4:12 PM      Result Value Ref Range Status   Specimen Description BLOOD RIGHT ARM   Final   Special Requests BOTTLES DRAWN AEROBIC AND ANAEROBIC 5CC EACH   Final   Culture  Setup Time     Final   Value: 03/16/2014 22:46     Performed at [facility]   Culture     Final   Value:        BLOOD CULTURE RECEIVED NO GROWTH TO DATE CULTURE WILL BE HELD FOR 5 DAYS BEFORE ISSUING A FINAL NEGATIVE REPORT     Performed at [facility]   Report Status PENDING   Incomplete  CULTURE, BLOOD (ROUTINE X 2)     Status: None   Collection Time    03/16/14  4:24 PM      Result  Value Ref Range Status   Specimen Description BLOOD RIGHT HAND   Final   Special Requests BOTTLES DRAWN AEROBIC AND ANAEROBIC 5CC   Final   Culture  Setup Time     Final   Value: 03/16/2014 22:46     Performed at [facility]  Culture     Final   Value:        BLOOD CULTURE RECEIVED NO GROWTH TO DATE CULTURE WILL BE HELD FOR 5 DAYS BEFORE ISSUING A FINAL NEGATIVE REPORT     [facility]   Report Status PENDING   Incomplete  MRSA PCR SCREENING     Status: None   Collection Time    03/16/14  5:34 PM      Result Value Ref Range Status   MRSA by PCR NEGATIVE  NEGATIVE Final   Comment:            The GeneXpert MRSA Assay (FDA     approved for NASAL specimens     only), is one component of a     comprehensive MRSA colonization     surveillance program. It is not     intended to diagnose MRSA     infection nor to guide or     monitor treatment for     MRSA infections.  CULTURE, EXPECTORATED SPUTUM-ASSESSMENT     Status: None   Collection Time    03/17/14  5:35 AM      Result Value Ref Range Status   Specimen Description SPUTUM   Final   Special Requests NONE   Final   Sputum evaluation     Final   Value: THIS SPECIMEN IS ACCEPTABLE. RESPIRATORY CULTURE REPORT TO FOLLOW.   Report Status 03/17/2014 FINAL   Final  CULTURE, RESPIRATORY (NON-EXPECTORATED)     Status: None   Collection Time    03/17/14  5:35 AM      Result Value Ref Range Status   Specimen Description SPUTUM   Final   Special Requests NONE   Final   Gram Stain     Final   Value: FEW WBC PRESENT, PREDOMINANTLY PMN     MODERATE SQUAMOUS EPITHELIAL CELLS PRESENT     RARE GRAM NEGATIVE RODS     RARE GRAM POSITIVE RODS     Performed at [facility]   Culture     Final   Value: Culture reincubated for better growth     Performed at [facility]   Report Status PENDING   Incomplete     Studies: Dg Chest 1 View  03/18/2014   CLINICAL DATA:  Post thoracentesis.  EXAM: CHEST  - 1 VIEW  COMPARISON:  CT CHEST W/O CM dated 03/16/2014  FINDINGS: Minimal right pleural fluid is observed. There is no pneumothorax. Prominent airspace opacity right upper lobe re- demonstrated.  IMPRESSION: Improved right pleural effusion.  No visible pneumothorax.   Electronically Signed   By: Davonna BellingJohn  Curnes M.D.   On: 03/18/2014 10:35   Dg Chest 2 View  03/16/2014   CLINICAL DATA:  Cough.  Hemoptysis.  EXAM: CHEST  2 VIEW  COMPARISON:  10/31/2008  FINDINGS: The cardiac silhouette, mediastinal and hilar contours are within normal limits and stable. There is tortuosity and calcification of the thoracic aorta. There are suspected underlying emphysematous changes with superimposed bilateral infiltrates. Small bilateral pleural effusions are noted. The bony thorax is intact.  IMPRESSION: Suspect underlying emphysematous changes.  Bilateral lung infiltrates.  Small bilateral pleural effusions.   Electronically Signed   By: Loralie ChampagneMark  Gallerani M.D.   On: 03/16/2014 15:27   Ct Chest Wo Contrast  03/16/2014   CLINICAL DATA:  Hemoptysis.  EXAM: CT CHEST WITHOUT CONTRAST  TECHNIQUE: Multidetector CT imaging of the chest was performed following the standard protocol  without IV contrast.  COMPARISON:  DG CHEST 2 VIEW dated 03/16/2014  FINDINGS: Atherosclerotic vascular calcification noted of the thoracic aorta. Aorta normal caliber. Heart size normal. Coronary artery disease.  Multiple mediastinal and paratracheal lymph nodes noted. Largest lymph node is retrocaval pretracheal, image number 22. This measures 1.4 cm. Thoracic esophagus unremarkable.  Large airways are patent. Prominent patchy pulmonary infiltrates noted throughout the right lung most consistent with pneumonia. Associated right-sided pleural effusion is present. Small left pleural effusion cannot be excluded. Mild basilar atelectasis noted on the left. No pneumothorax.  Adrenals are normal.  Gallstone noted.  Degenerative changes thoracic spine. No acute bony  abnormality identified. Asymmetric soft tissue density left breast. Mammography is suggested to exclude a breast mass.  IMPRESSION: 1. Prominent pulmonary infiltrates noted throughout the right lung with moderate size right pleural effusion. 2. Mild left basilar atelectasis and small pleural effusion on the left. 3. Asymmetric soft tissue density of the left breast. Although this patient is a male, mammography is suggested as a left breast malignancy cannot be excluded. 4. Coronary artery disease. 5. Multiple small mediastinal lymph nodes go largest measures 1.4 cm in the retrocaval pretracheal region. These are nonspecific.   Electronically Signed   By: Maisie Fus  Register   On: 03/16/2014 17:20   US Thoracentesis Asp Pleural Space W/img Guide  03/18/2014   CLINICAL DATA:  Pneumonia, right-sided pleural effusion. Request diagnostic and therapeutic thoracentesis.  EXAM: ULTRASOUND GUIDED right THORACENTESIS  COMPARISON:  None.  PROCEDURE: An ultrasound guided thoracentesis was thoroughly discussed with the patient and questions answered. The benefits, risks, alternatives and complications were also discussed. The patient understands and wishes to proceed with the procedure. Written consent was obtained.  Ultrasound was performed to localize and mark an adequate pocket of fluid in the right chest. The area was then prepped and draped in the normal sterile fashion. 1% Lidocaine was used for local anesthesia. Under ultrasound guidance a 19 gauge Yueh catheter was introduced. Thoracentesis was performed. The catheter was removed and a dressing applied.  Complications:  None immediate  FINDINGS: A total of approximately 380 mL of clear yellow fluid was removed. A fluid sample wassent for laboratory analysis.  IMPRESSION: Successful ultrasound guided right thoracentesis yielding 380 mL of pleural fluid.  Read by: Brayton El PA-C   Electronically Signed   By: Irish Lack M.D.   On: 03/18/2014 10:59    Scheduled  Meds: . albuterol  2.5 mg Nebulization Q4H  . allopurinol  100 mg Oral q morning - 10a  . amLODipine  10 mg Oral Daily  . antiseptic oral rinse  15 mL Mouth Rinse q12n4p  . aspirin EC  81 mg Oral Daily  . atorvastatin  20 mg Oral q1800  . calcitRIOL  0.25 mcg Oral Daily  . ceFEPime (MAXIPIME) IV  2 g Intravenous Q24H  . chlorhexidine  15 mL Mouth/Throat BID  . citalopram  60 mg Oral Daily  . heparin  5,000 Units Subcutaneous 3 times per day  . hydrALAZINE  25 mg Oral TID  . insulin aspart  0-5 Units Subcutaneous QHS  . insulin aspart  0-9 Units Subcutaneous TID WC  . insulin NPH Human  30 Units Subcutaneous BID AC  . mometasone-formoterol  2 puff Inhalation BID  . omega-3 acid ethyl esters  1 g Oral BID  . potassium chloride SA  20 mEq Oral BID  . propranolol  10 mg Oral BID  . sodium chloride  3 mL Intravenous Q12H  .  tamsulosin  0.4 mg Oral Daily  . tiotropium  18 mcg Inhalation Daily  . traZODone  100 mg Oral QHS  . vancomycin  1,500 mg Intravenous Q24H  . [START ON 03/21/2014] Vitamin D (Ergocalciferol)  50,000 Units Oral Q Thu   Continuous Infusions:     Time spent: 25 minutes    Douglas [name]  [facility] Pager [phone]. If 7PM-7AM, please contact night-coverage at [email or web address], password [facility] 03/18/2014, 1:30 PM  LOS: 2 days

## 2014-03-18 NOTE — Progress Notes (Signed)
  Echocardiogram 2D Echocardiogram has been performed.  Georgian CoWILLIAMS, Deondra Wigger 03/18/2014, 12:21 PM

## 2014-03-19 DIAGNOSIS — F17200 Nicotine dependence, unspecified, uncomplicated: Secondary | ICD-10-CM

## 2014-03-19 LAB — BASIC METABOLIC PANEL
BUN: 43 mg/dL — AB (ref 6–23)
CHLORIDE: 105 meq/L (ref 96–112)
CO2: 23 mEq/L (ref 19–32)
Calcium: 8.4 mg/dL (ref 8.4–10.5)
Creatinine, Ser: 1.81 mg/dL — ABNORMAL HIGH (ref 0.50–1.35)
GFR calc Af Amer: 40 mL/min — ABNORMAL LOW (ref >90)
GFR calc non Af Amer: 35 mL/min — ABNORMAL LOW (ref >90)
GLUCOSE: 162 mg/dL — AB (ref 70–99)
Potassium: 4.3 mEq/L (ref 3.7–5.3)
SODIUM: 142 meq/L (ref 137–147)

## 2014-03-19 LAB — GLUCOSE, CAPILLARY
GLUCOSE-CAPILLARY: 171 mg/dL — AB (ref 70–99)
GLUCOSE-CAPILLARY: 98 mg/dL (ref 70–99)
Glucose-Capillary: 179 mg/dL — ABNORMAL HIGH (ref 70–99)
Glucose-Capillary: 169 mg/dL — ABNORMAL HIGH (ref 70–99)

## 2014-03-19 LAB — CULTURE, RESPIRATORY W GRAM STAIN: Culture: NORMAL

## 2014-03-19 LAB — CULTURE, RESPIRATORY

## 2014-03-19 MED ORDER — ALBUTEROL SULFATE (2.5 MG/3ML) 0.083% IN NEBU
2.5000 mg | INHALATION_SOLUTION | Freq: Four times a day (QID) | RESPIRATORY_TRACT | Status: DC
  Administered 2014-03-19 – 2014-03-20 (×4): 2.5 mg via RESPIRATORY_TRACT
  Filled 2014-03-19 (×4): qty 3

## 2014-03-19 MED ORDER — VITAMIN B-12 1000 MCG PO TABS
1000.0000 ug | ORAL_TABLET | Freq: Every day | ORAL | Status: DC
  Administered 2014-03-19 – 2014-03-20 (×2): 1000 ug via ORAL
  Filled 2014-03-19 (×3): qty 1

## 2014-03-19 MED ORDER — INSULIN NPH (HUMAN) (ISOPHANE) 100 UNIT/ML ~~LOC~~ SUSP
40.0000 [IU] | Freq: Two times a day (BID) | SUBCUTANEOUS | Status: DC
  Administered 2014-03-19 – 2014-03-20 (×2): 40 [IU] via SUBCUTANEOUS

## 2014-03-19 NOTE — Progress Notes (Signed)
Placed patient on CPAP via FFM with 3 lpm O2 bleed in, auto titrate settings (min 6.0 max 20.0) cm H20. Patient tolerating well at this time.   RN aware.

## 2014-03-19 NOTE — Clinical Documentation Improvement (Signed)
   Please document the clinical significance, if any, of the following abnormal results:  Pleural Effusion Pathology   - "Reactive Mesothelial Cells"   Thank You, Jerral Ralphathy R Spiro Ausborn ,RN BSN CCDS Certified Clinical Documentation Specialist:  [phone] [facility]- Health Information Management

## 2014-03-19 NOTE — Progress Notes (Addendum)
[facility] PROGRESS NOTE  Douglas [name]:[id number] DOB: 1936/01/27 DOA: 03/16/2014 PCP: Kathlee Nations, MD   Brief narrative  78 y/o male with hx of diastolic CHF, HTN, DM, CAD, COPD and OSA on BiPAP , CKD ( baseline 1.6-1.8) who [facility] after being admitted for N,V and diarrhea,  presented with one day hx of SOB with hemoptysis. patient reports progressive SOB for past  2 weeks with productive cough and subjective fever. patient afebrile with normal wbc on presentative.   CXR showed b/l lung infiltrates for which  a CT chest w/o contrast was done which again showed right sided infiltrate with moderate effusion.   Assessment/Plan:  Healthcare assocaited pneumonia patient maintains o2 sat on 2L. continue IV vanco and cefepime. Narrow coverage based on clinical improvement and cx results.  Hemoptysis possibly related to pneumonia and now improved.  blood cx, sputum cx so far negative. Urine strep ag and legionella ag negative. deneis hx TB or sick contast,. Reports losing about 6-7 lbs in 2 weeks.  Has moderate right pleural effusion on CT scan. Underwent IR guided thoracentesis with 280 cc fluid removed. Appears to be transudave Sent fluid for cx and cytology given hx of heavy smoking. Supportive care with  nebs, antitussives and tylenol. o2 sats stable on RA but drops to 87 on exertion. Will monitor another day and recheck sats. He may need to go home on 2L o2 via [place] with ambulation.  DM continue  novolin 40 units BID and SSI. A1C 9.2  CKD  baseline from 2012 between 1.6-1.8.  Renal fn improved after Holding diuretics and gentle hydration.   COPD  continue advair, spiriva. Added prn albuterol nebs  HTN  continue amlodipine And hydralazine  Hypokalemia  replenish  Anemia irone def noted on irn panel. Check stool for occult blood. Will add iron supplements prior to discharge.   Protein calorie malnutrition  nutrition consult   Hx of  diastolic CHF and CAD Appears euvolemic although has elevated pro BNP. 2D echo shows normal EF and comments on possible diastolic dysfn Continue ASA, lipitor  Left breast soft tissue density  noted on CT scan. His left breast does appear larger than right. Needs mammogram as outpt  OSA  continue CPAP at night  Major depression  seen by psych . recommends to disontinue cymbalta and increase dose fo celexa to 60 mg in am and trazodone to 100 mg qhs.   Diet: diabetic  DVT prophylaxis: sq heparin  Code Status: full code Family Communication: none at bedside. discussed with daughter on 4/5 Disposition Plan: home once improved likely in 1-2 days   Consultants:  none  Procedures:  IR guided rt thoracentesis on 4/6  Antibiotics:  IV vanco and cefepimeme ( 4/4>>)  HPI/Subjective: Patient seen and examined this am. Reports SOB to be  better.  Objective: Filed Vitals:   03/19/14 0403  BP: 157/82  Pulse: 78  Temp: 99 F (37.2 C)  Resp: 20    Intake/Output Summary (Last 24 hours) at 03/19/14 1208 Last data filed at 03/19/14 1000  Gross per 24 hour  Intake   1127 ml  Output   2275 ml  Net  -1148 ml   Filed Weights   03/17/14 0441 03/18/14 0500 03/19/14 0403  Weight: 106 kg (233 lb 11 oz) 106.2 kg (234 lb 2.1 oz) 107.366 kg (236 lb 11.2 oz)    Exam:   General:  Elderly obese male in NAD  HEENT:  no pallor, moist oral mucosa,   Chest: clear breath sounds b/l, Left breast> right  Cardiovascular: N S1&S2, no murmurs, rubs or gallop  Abdomen: soft, NT, ND, BS+  Musculoskeletal: warm, no edema   CNS: AAOX3   Data Reviewed: Basic Metabolic Panel:  Recent Labs Lab 03/16/14 1309 03/17/14 0549 03/18/14 0658 03/19/14 0645  NA 135* 138 141 142  K 2.9* 3.2* 3.6* 4.3  CL 95* 98 102 105  CO2 26 23 25 23   GLUCOSE 105* 207* 84 162*  BUN 50* 44* 41* 43*  CREATININE 2.07* 1.97* 1.76* 1.81*  CALCIUM 7.9* 7.3* 8.1* 8.4  MG  --  2.0  --   --    Liver Function  Tests:  Recent Labs Lab 03/16/14 1309 03/17/14 0549  AST 22 28  ALT 33 33  ALKPHOS 98 101  BILITOT 0.8 0.9  PROT 6.4 5.4*  ALBUMIN 2.5* 2.3*   No results found for this basename: LIPASE, AMYLASE,  in the last 168 hours No results found for this basename: AMMONIA,  in the last 168 hours CBC:  Recent Labs Lab 03/16/14 1309 03/17/14 0549  WBC 6.7 5.7  NEUTROABS 4.7  --   HGB 10.9* 10.3*  HCT 31.7* 30.0*  MCV 91.1 90.6  PLT 169 189   Cardiac Enzymes: No results found for this basename: CKTOTAL, CKMB, CKMBINDEX, TROPONINI,  in the last 168 hours BNP (last 3 results)  Recent Labs  03/16/14 1309  PROBNP 3251.0*   CBG:  Recent Labs Lab 03/18/14 1202 03/18/14 1709 03/18/14 2125 03/19/14 0751 03/19/14 1146  GLUCAP 142* 236* 233* 179* 171*    Recent Results (from the past 240 hour(s))  URINE CULTURE     Status: None   Collection Time    03/16/14  4:10 PM      Result Value Ref Range Status   Specimen Description URINE, CLEAN CATCH   Final   Special Requests NONE   Final   Culture  Setup Time     Final   Value: 03/16/2014 21:56     Performed at [facility] Count     Final   Value: 35,000 COLONIES/ML     Performed at [facility]   Culture     Final   Value: Multiple bacterial morphotypes present, none predominant. Suggest appropriate recollection if clinically indicated.     Performed at [facility]   Report Status 03/18/2014 FINAL   Final  CULTURE, BLOOD (ROUTINE X 2)     Status: None   Collection Time    03/16/14  4:12 PM      Result Value Ref Range Status   Specimen Description BLOOD RIGHT ARM   Final   Special Requests BOTTLES DRAWN AEROBIC AND ANAEROBIC 5CC EACH   Final   Culture  Setup Time     Final   Value: 03/16/2014 22:46     Performed at [facility]   Culture     Final   Value:        BLOOD CULTURE RECEIVED NO GROWTH TO DATE CULTURE WILL BE HELD FOR 5 DAYS BEFORE ISSUING A FINAL NEGATIVE REPORT      Performed at [facility]   Report Status PENDING   Incomplete  CULTURE, BLOOD (ROUTINE X 2)     Status: None   Collection Time    03/16/14  4:24 PM      Result Value Ref Range Status   Specimen Description BLOOD  RIGHT HAND   Final   Special Requests BOTTLES DRAWN AEROBIC AND ANAEROBIC 5CC   Final   Culture  Setup Time     Final   Value: 03/16/2014 22:46     Performed at [facility]   Culture     Final   Value:        BLOOD CULTURE RECEIVED NO GROWTH TO DATE CULTURE WILL BE HELD FOR 5 DAYS BEFORE ISSUING A FINAL NEGATIVE REPORT     Performed at [facility]   Report Status PENDING   Incomplete  MRSA PCR SCREENING     Status: None   Collection Time    03/16/14  5:34 PM      Result Value Ref Range Status   MRSA by PCR NEGATIVE  NEGATIVE Final   Comment:            The GeneXpert MRSA Assay (FDA     approved for NASAL specimens     only), is one component of a     comprehensive MRSA colonization     surveillance program. It is not     intended to diagnose MRSA     infection nor to guide or     monitor treatment for     MRSA infections.  CULTURE, EXPECTORATED SPUTUM-ASSESSMENT     Status: None   Collection Time    03/17/14  5:35 AM      Result Value Ref Range Status   Specimen Description SPUTUM   Final   Special Requests NONE   Final   Sputum evaluation     Final   Value: THIS SPECIMEN IS ACCEPTABLE. RESPIRATORY CULTURE REPORT TO FOLLOW.   Report Status 03/17/2014 FINAL   Final  CULTURE, RESPIRATORY (NON-EXPECTORATED)     Status: None   Collection Time    03/17/14  5:35 AM      Result Value Ref Range Status   Specimen Description SPUTUM   Final   Special Requests NONE   Final   Gram Stain     Final   Value: FEW WBC PRESENT, PREDOMINANTLY PMN     MODERATE SQUAMOUS EPITHELIAL CELLS PRESENT     RARE GRAM NEGATIVE RODS     RARE GRAM POSITIVE RODS     Performed at [facility]   Culture     Final   Value: NORMAL OROPHARYNGEAL FLORA      Performed at [facility]   Report Status 03/19/2014 FINAL   Final  BODY FLUID CULTURE     Status: None   Collection Time    03/18/14 10:10 AM      Result Value Ref Range Status   Specimen Description FLUID RIGHT PLEURAL   Final   Special Requests Normal   Final   Gram Stain     Final   Value: RARE WBC PRESENT, PREDOMINANTLY MONONUCLEAR     NO ORGANISMS SEEN     Performed at [facility]   Culture     Final   Value: NO GROWTH 1 DAY     Performed at [facility]   Report Status PENDING   Incomplete     Studies: Dg Chest 1 View  03/18/2014   CLINICAL DATA:  Post thoracentesis.  EXAM: CHEST - 1 VIEW  COMPARISON:  CT CHEST W/O CM dated 03/16/2014  FINDINGS: Minimal right pleural fluid is observed. There is no pneumothorax. Prominent airspace opacity right upper lobe re- demonstrated.  IMPRESSION: Improved right pleural  effusion.  No visible pneumothorax.   Electronically Signed   By: Davonna BellingJohn  Curnes M.D.   On: 03/18/2014 10:35   [place] Thoracentesis Asp Pleural Space W/img Guide  03/18/2014   CLINICAL DATA:  Pneumonia, right-sided pleural effusion. Request diagnostic and therapeutic thoracentesis.  EXAM: ULTRASOUND GUIDED right THORACENTESIS  COMPARISON:  None.  PROCEDURE: An ultrasound guided thoracentesis was thoroughly discussed with the patient and questions answered. The benefits, risks, alternatives and complications were also discussed. The patient understands and wishes to proceed with the procedure. Written consent was obtained.  Ultrasound was performed to localize and mark an adequate pocket of fluid in the right chest. The area was then prepped and draped in the normal sterile fashion. 1% Lidocaine was used for local anesthesia. Under ultrasound guidance a 19 gauge Yueh catheter was introduced. Thoracentesis was performed. The catheter was removed and a dressing applied.  Complications:  None immediate  FINDINGS: A total of approximately 380 mL of clear yellow fluid was  removed. A fluid sample wassent for laboratory analysis.  IMPRESSION: Successful ultrasound guided right thoracentesis yielding 380 mL of pleural fluid.  Read by: Brayton ElKevin Bruning PA-C   Electronically Signed   By: Irish LackGlenn  Yamagata M.D.   On: 03/18/2014 10:59    Scheduled Meds: . albuterol  2.5 mg Nebulization QID  . allopurinol  100 mg Oral q morning - 10a  . amLODipine  10 mg Oral Daily  . antiseptic oral rinse  15 mL Mouth Rinse q12n4p  . aspirin EC  81 mg Oral Daily  . atorvastatin  20 mg Oral q1800  . calcitRIOL  0.25 mcg Oral Daily  . ceFEPime (MAXIPIME) IV  2 g Intravenous Q24H  . chlorhexidine  15 mL Mouth/Throat BID  . citalopram  60 mg Oral Daily  . heparin  5,000 Units Subcutaneous 3 times per day  . hydrALAZINE  25 mg Oral TID  . insulin aspart  0-5 Units Subcutaneous QHS  . insulin aspart  0-9 Units Subcutaneous TID WC  . insulin NPH Human  35 Units Subcutaneous BID AC  . mometasone-formoterol  2 puff Inhalation BID  . omega-3 acid ethyl esters  1 g Oral BID  . potassium chloride SA  20 mEq Oral BID  . propranolol  10 mg Oral BID  . sodium chloride  3 mL Intravenous Q12H  . tamsulosin  0.4 mg Oral Daily  . tiotropium  18 mcg Inhalation Daily  . traZODone  100 mg Oral QHS  . vancomycin  1,500 mg Intravenous Q24H  . vitamin B-12  1,000 mcg Oral Daily  . [START ON 03/21/2014] Vitamin D (Ergocalciferol)  50,000 Units Oral Q Thu   Continuous Infusions:     Time spent: 25 minutes    Navaeh Kehres  [facility] Pager [phone]. If 7PM-7AM, please contact night-coverage at [email or web address], password [email or web address] 03/19/2014, 12:08 PM  LOS: 3 days

## 2014-03-20 DIAGNOSIS — R0609 Other forms of dyspnea: Secondary | ICD-10-CM

## 2014-03-20 DIAGNOSIS — R079 Chest pain, unspecified: Secondary | ICD-10-CM

## 2014-03-20 DIAGNOSIS — E1149 Type 2 diabetes mellitus with other diabetic neurological complication: Secondary | ICD-10-CM

## 2014-03-20 DIAGNOSIS — E871 Hypo-osmolality and hyponatremia: Secondary | ICD-10-CM

## 2014-03-20 DIAGNOSIS — R0989 Other specified symptoms and signs involving the circulatory and respiratory systems: Secondary | ICD-10-CM

## 2014-03-20 DIAGNOSIS — J189 Pneumonia, unspecified organism: Principal | ICD-10-CM

## 2014-03-20 DIAGNOSIS — R042 Hemoptysis: Secondary | ICD-10-CM

## 2014-03-20 DIAGNOSIS — N183 Chronic kidney disease, stage 3 unspecified: Secondary | ICD-10-CM

## 2014-03-20 LAB — GLUCOSE, CAPILLARY
Glucose-Capillary: 126 mg/dL — ABNORMAL HIGH (ref 70–99)
Glucose-Capillary: 112 mg/dL — ABNORMAL HIGH (ref 70–99)

## 2014-03-20 MED ORDER — CITALOPRAM HYDROBROMIDE 20 MG PO TABS: 60.0000 mg | ORAL_TABLET | Freq: Every day | ORAL | Status: DC

## 2014-03-20 MED ORDER — FERROUS SULFATE 325 (65 FE) MG PO TABS: 325.0000 mg | ORAL_TABLET | Freq: Two times a day (BID) | ORAL | Status: AC

## 2014-03-20 MED ORDER — TRAZODONE HCL 100 MG PO TABS: 100.0000 mg | ORAL_TABLET | Freq: Every day | ORAL | Status: AC

## 2014-03-20 MED ORDER — LEVOFLOXACIN 750 MG PO TABS
750.0000 mg | ORAL_TABLET | ORAL | Status: DC
  Administered 2014-03-20: 750 mg via ORAL
  Filled 2014-03-20: qty 1

## 2014-03-20 MED ORDER — LEVOFLOXACIN 750 MG PO TABS: 750.0000 mg | ORAL_TABLET | ORAL | Status: DC

## 2014-03-20 MED ORDER — INSULIN NPH (HUMAN) (ISOPHANE) 100 UNIT/ML ~~LOC~~ SUSP: 55.0000 [IU] | Freq: Two times a day (BID) | SUBCUTANEOUS | Status: AC

## 2014-03-20 NOTE — Progress Notes (Signed)
SATURATION QUALIFICATIONS: (This note is used to comply with regulatory documentation for home oxygen)  Patient Saturations on Room Air at Rest = 92%-95%.  Patient Saturations on Room Air while Ambulating = 88%-90%%

## 2014-03-20 NOTE — Discharge Instructions (Signed)
Please take the one pill of Levaquin on 03/22/2014.  That will complete your antibiotics. Please see Dr. Maryellen PileEason in 1 week for follow up of Pneumonia, Pleural Effusion, and uncontrolled diabetes.  You have a Left Chest soft tissue density.  You need a mammogram as outpatient. Please ask Dr. Maryellen PileEason to arrange this for you.  You have been started on IRON.  You will need a stool softener daily while on iron to avoid constipation.  Expect darkened stools while on iron.

## 2014-03-20 NOTE — Discharge Summary (Signed)
Physician Discharge Summary  Douglas CobbleSamuel H Hornak WUJ:[id number] DOB: [birth year] DOA: 03/16/2014  PCP: Kathlee NationsEASON,PAUL, MD  Admit date: 03/16/2014 Discharge date: 03/20/2014  Time spent: 50 minutes  Recommendations for Outpatient Follow-up:  1. Chest Xray in 2-4 weeks.  To ensure resolution of pneumonia and pleural effusion 2. Diabetic management.  A1C is 9.2 3. Left breast soft tissue density - needs mammogram as outpatient. 4. Iron deficiency anemia.  Stable for outpatient follow up.  Discharge Diagnoses:  Principal Problem:   Healthcare-associated pneumonia Active Problems:   HYPERLIPIDEMIA   Chronic renal disease, stage 3, moderately decreased glomerular filtration rate (GFR) between 30-59 mL/min/1.73 square meter   Hyponatremia   Major depression   HCAP (healthcare-associated pneumonia)   Discharge Condition: stable Diet recommendation: heart healthy  Filed Weights   03/18/14 0500 03/19/14 0403 03/20/14 0532  Weight: 106.2 kg (234 lb 2.1 oz) 107.366 kg (236 lb 11.2 oz) 107.502 kg (237 lb)    History of present illness:  78 y/o male with hx of diastolic CHF, HTN, DM, CAD, COPD and OSA on BiPAP , CKD ( baseline 1.6-1.8) who was [facility] week after being admitted for N,V and diarrhea, presented with one day hx of SOB with hemoptysis. patient reports progressive SOB for past 2 weeks with productive cough and subjective fever. Patient afebrile with normal wbc on presentation.  CXR showed b/l lung infiltrates for which a CT chest w/o contrast was done which again showed right sided infiltrate with moderate effusion.   Hospital Course:   Healthcare associated pneumonia   Patient was treated with 4 days of IV antibiotics.  This has been narrowed to oral levaquin at discharge.  He required oxygen via nasal canula.  On 4/8 his ambulating oxygen sats remained within normal limits with out oxygen.  Hemoptysis was likely related to pneumonia and has now  resolved  blood cx shows no growth to date.  Urine strep ag and legionella ag negative.  Moderate right pleural effusion on CT scan. Underwent IR guided thoracentesis with 280 cc fluid removed.   Appears to be transudave. Cytology shows PLEURAL FLUID, RIGHT = REACTIVE MESOTHELIAL CELLS. These are from PNA.  Feeling much better and ready for discharge.   DM - uncontrolled.  Increased NPH to 55 units bid.  Continued humalin at 20 TID with meals  A1C is 9.2  CKD   baseline from 2012 between 1.6-1.8. Renal fn improved after holding diuretics and gentle hydration.   Diuretics were restarted on discharge.  Recommend BMET in 1-2 weeks.  COPD   continue advair, spiriva.   HTN   continue amlodipine, hydralazine   Hypokalemia   replenished  Iron def Anemia   iron def noted on anemia panel.   Stable for outpatient follow up.  Protein calorie malnutrition   nutrition consult completed.  Hx of diastolic CHF and CAD   Appears euvolemic although has elevated pro BNP.   2D echo shows normal EF and comments on possible diastolic dysfn   Continue ASA, lipitor   Left breast soft tissue density   noted on CT scan. His left breast does appear larger than right.   Needs mammogram as outpt   OSA   continue CPAP at night   Major depression   Seen by psychiatry   recommended disontinuing cymbalta and increasing dose of celexa to 60 mg in am and trazodone to 100 mg qhs.      Procedures: Thoracentesis 03/18/14  Consultations:  Discharge Exam: Filed Vitals:  03/20/14 1153  BP:   Pulse: 80  Temp:   Resp: 18    General: 78 yo male in NAD, A&O with clear speech. HEENT: no pallor, moist oral mucosa,  Chest: clear breath sounds b/l,  Cardiovascular: N S1&S2, no murmurs, rubs or gallop  Abdomen: soft, NT, ND, BS+  Musculoskeletal: warm, 1-2+ edema (which he states is chronic)  Discharge Instructions       Discharge Orders   Future Orders Complete By  Expires   Diet - low sodium heart healthy  As directed    Increase activity slowly  As directed        Medication List    STOP taking these medications       DULoxetine 60 MG capsule  Commonly known as:  CYMBALTA      TAKE these medications       ADVAIR DISKUS 250-50 MCG/DOSE Aepb  Generic drug:  Fluticasone-Salmeterol  Inhale 1 puff into the lungs every 12 (twelve) hours.     allopurinol 100 MG tablet  Commonly known as:  ZYLOPRIM  Take 100 mg by mouth every morning.     amLODipine 10 MG tablet  Commonly known as:  NORVASC  Take 10 mg by mouth daily.     aspirin EC 81 MG tablet  Take 81 mg by mouth daily.     calcitRIOL 0.25 MCG capsule  Commonly known as:  ROCALTROL  Take 0.25 mcg by mouth daily.     citalopram 20 MG tablet  Commonly known as:  CELEXA  Take 3 tablets (60 mg total) by mouth daily.     ferrous sulfate 325 (65 FE) MG tablet  Take 1 tablet (325 mg total) by mouth 2 (two) times daily with a meal.     furosemide 40 MG tablet  Commonly known as:  LASIX  Take 40-80 mg by mouth 2 (two) times daily. Take 2 tablets (80 mg) in the morning and 1 tablet (40 mg) in the evening.  May take an extra dose in the evening for increased swelling.     hydrALAZINE 25 MG tablet  Commonly known as:  APRESOLINE  Take 25 mg by mouth 3 (three) times daily.     insulin NPH Human 100 UNIT/ML injection  Commonly known as:  HUMULIN N,NOVOLIN N  Inject 0.55 mLs (55 Units total) into the skin 2 (two) times daily before a meal.     insulin regular 100 units/mL injection  Commonly known as:  NOVOLIN R,HUMULIN R  Inject 20 Units into the skin 3 (three) times daily before meals.     levofloxacin 750 MG tablet  Commonly known as:  LEVAQUIN  Take 1 tablet (750 mg total) by mouth every other day.  Start taking on:  03/22/2014     metolazone 5 MG tablet  Commonly known as:  ZAROXOLYN  Take 5 mg by mouth daily.     omega-3 acid ethyl esters 1 G capsule  Commonly known as:   LOVAZA  Take 1 g by mouth 2 (two) times daily.     potassium chloride SA 20 MEQ tablet  Commonly known as:  K-DUR,KLOR-CON  Take 20 mEq by mouth 2 (two) times daily.     propranolol 10 MG tablet  Commonly known as:  INDERAL  Take 10 mg by mouth 2 (two) times daily.     rosuvastatin 10 MG tablet  Commonly known as:  CRESTOR  Take 10 mg by mouth daily.     tamsulosin 0.4  MG Caps capsule  Commonly known as:  FLOMAX  Take 0.4 mg by mouth daily.     tiotropium 18 MCG inhalation capsule  Commonly known as:  SPIRIVA  Place 18 mcg into inhaler and inhale daily.     traZODone 100 MG tablet  Commonly known as:  DESYREL  Take 1 tablet (100 mg total) by mouth at bedtime.     Vitamin D (Ergocalciferol) 50000 UNITS Caps capsule  Commonly known as:  DRISDOL  Take 50,000 Units by mouth every Thursday.       Allergies  Allergen Reactions  . Ambien [Zolpidem] Other (See Comments)    "becomes violent"  . Bupropion Hcl     REACTION: shaking   Follow-up Information   Follow up with EASON,PAUL, MD In 1 week.   Specialty:  Internal Medicine   Contact information:   [address] [place] [address] [phone]        The results of significant diagnostics from this hospitalization (including imaging, microbiology, ancillary and laboratory) are listed below for reference.    Significant Diagnostic Studies: Dg Chest 1 View  03/18/2014   CLINICAL DATA:  Post thoracentesis.  EXAM: CHEST - 1 VIEW  COMPARISON:  CT CHEST W/O CM dated 03/16/2014  FINDINGS: Minimal right pleural fluid is observed. There is no pneumothorax. Prominent airspace opacity right upper lobe re- demonstrated.  IMPRESSION: Improved right pleural effusion.  No visible pneumothorax.   Electronically Signed   By: Davonna Belling M.D.   On: 03/18/2014 10:35   Dg Chest 2 View  03/16/2014   CLINICAL DATA:  Cough.  Hemoptysis.  EXAM: CHEST  2 VIEW  COMPARISON:  10/31/2008  FINDINGS: The cardiac silhouette, mediastinal and  hilar contours are within normal limits and stable. There is tortuosity and calcification of the thoracic aorta. There are suspected underlying emphysematous changes with superimposed bilateral infiltrates. Small bilateral pleural effusions are noted. The bony thorax is intact.  IMPRESSION: Suspect underlying emphysematous changes.  Bilateral lung infiltrates.  Small bilateral pleural effusions.   Electronically Signed   By: Loralie Champagne M.D.   On: 03/16/2014 15:27   Ct Chest Wo Contrast  03/16/2014   CLINICAL DATA:  Hemoptysis.  EXAM: CT CHEST WITHOUT CONTRAST  TECHNIQUE: Multidetector CT imaging of the chest was performed following the standard protocol without IV contrast.  COMPARISON:  DG CHEST 2 VIEW dated 03/16/2014  FINDINGS: Atherosclerotic vascular calcification noted of the thoracic aorta. Aorta normal caliber. Heart size normal. Coronary artery disease.  Multiple mediastinal and paratracheal lymph nodes noted. Largest lymph node is retrocaval pretracheal, image number 22. This measures 1.4 cm. Thoracic esophagus unremarkable.  Large airways are patent. Prominent patchy pulmonary infiltrates noted throughout the right lung most consistent with pneumonia. Associated right-sided pleural effusion is present. Small left pleural effusion cannot be excluded. Mild basilar atelectasis noted on the left. No pneumothorax.  Adrenals are normal.  Gallstone noted.  Degenerative changes thoracic spine. No acute bony abnormality identified. Asymmetric soft tissue density left breast. Mammography is suggested to exclude a breast mass.  IMPRESSION: 1. Prominent pulmonary infiltrates noted throughout the right lung with moderate size right pleural effusion. 2. Mild left basilar atelectasis and small pleural effusion on the left. 3. Asymmetric soft tissue density of the left breast. Although this patient is a male, mammography is suggested as a left breast malignancy cannot be excluded. 4. Coronary artery disease. 5.  Multiple small mediastinal lymph nodes go largest measures 1.4 cm in the retrocaval pretracheal region.  These are nonspecific.   Electronically Signed   By: Maisie Fus  Register   On: 03/16/2014 17:20   US Thoracentesis Asp Pleural Space W/img Guide  03/18/2014   CLINICAL DATA:  Pneumonia, right-sided pleural effusion. Request diagnostic and therapeutic thoracentesis.  EXAM: ULTRASOUND GUIDED right THORACENTESIS  COMPARISON:  None.  PROCEDURE: An ultrasound guided thoracentesis was thoroughly discussed with the patient and questions answered. The benefits, risks, alternatives and complications were also discussed. The patient understands and wishes to proceed with the procedure. Written consent was obtained.  Ultrasound was performed to localize and mark an adequate pocket of fluid in the right chest. The area was then prepped and draped in the normal sterile fashion. 1% Lidocaine was used for local anesthesia. Under ultrasound guidance a 19 gauge Yueh catheter was introduced. Thoracentesis was performed. The catheter was removed and a dressing applied.  Complications:  None immediate  FINDINGS: A total of approximately 380 mL of clear yellow fluid was removed. A fluid sample wassent for laboratory analysis.  IMPRESSION: Successful ultrasound guided right thoracentesis yielding 380 mL of pleural fluid.  Read by: Brayton El PA-C   Electronically Signed   By: Irish Lack M.D.   On: 03/18/2014 10:59    Microbiology: Recent Results (from the past 240 hour(s))  URINE CULTURE     Status: None   Collection Time    03/16/14  4:10 PM      Result Value Ref Range Status   Specimen Description URINE, CLEAN CATCH   Final   Special Requests NONE   Final   Culture  Setup Time     Final   Value: 03/16/2014 21:56     Performed at [facility] Count     Final   Value: 35,000 COLONIES/ML     Performed at [facility]   Culture     Final   Value: Multiple bacterial morphotypes present,  none predominant. Suggest appropriate recollection if clinically indicated.     Performed at [facility]   Report Status 03/18/2014 FINAL   Final  CULTURE, BLOOD (ROUTINE X 2)     Status: None   Collection Time    03/16/14  4:12 PM      Result Value Ref Range Status   Specimen Description BLOOD RIGHT ARM   Final   Special Requests BOTTLES DRAWN AEROBIC AND ANAEROBIC 5CC EACH   Final   Culture  Setup Time     Final   Value: 03/16/2014 22:46     Performed at [facility]   Culture     Final   Value:        BLOOD CULTURE RECEIVED NO GROWTH TO DATE CULTURE WILL BE HELD FOR 5 DAYS BEFORE ISSUING A FINAL NEGATIVE REPORT     Performed at [facility]   Report Status PENDING   Incomplete  CULTURE, BLOOD (ROUTINE X 2)     Status: None   Collection Time    03/16/14  4:24 PM      Result Value Ref Range Status   Specimen Description BLOOD RIGHT HAND   Final   Special Requests BOTTLES DRAWN AEROBIC AND ANAEROBIC 5CC   Final   Culture  Setup Time     Final   Value: 03/16/2014 22:46     Performed at [facility]   Culture     Final   Value:        BLOOD CULTURE RECEIVED NO GROWTH TO  DATE CULTURE WILL BE HELD FOR 5 DAYS BEFORE ISSUING A FINAL NEGATIVE REPORT     Performed at [facility]   Report Status PENDING   Incomplete  MRSA PCR SCREENING     Status: None   Collection Time    03/16/14  5:34 PM      Result Value Ref Range Status   MRSA by PCR NEGATIVE  NEGATIVE Final   Comment:            The GeneXpert MRSA Assay (FDA     approved for NASAL specimens     only), is one component of a     comprehensive MRSA colonization     surveillance program. It is not     intended to diagnose MRSA     infection nor to guide or     monitor treatment for     MRSA infections.  CULTURE, EXPECTORATED SPUTUM-ASSESSMENT     Status: None   Collection Time    03/17/14  5:35 AM      Result Value Ref Range Status   Specimen Description SPUTUM   Final   Special  Requests NONE   Final   Sputum evaluation     Final   Value: THIS SPECIMEN IS ACCEPTABLE. RESPIRATORY CULTURE REPORT TO FOLLOW.   Report Status 03/17/2014 FINAL   Final  CULTURE, RESPIRATORY (NON-EXPECTORATED)     Status: None   Collection Time    03/17/14  5:35 AM      Result Value Ref Range Status   Specimen Description SPUTUM   Final   Special Requests NONE   Final   Gram Stain     Final   Value: FEW WBC PRESENT, PREDOMINANTLY PMN     MODERATE SQUAMOUS EPITHELIAL CELLS PRESENT     RARE GRAM NEGATIVE RODS     RARE GRAM POSITIVE RODS     Performed at [facility]   Culture     Final   Value: NORMAL OROPHARYNGEAL FLORA     Performed at [facility]   Report Status 03/19/2014 FINAL   Final  BODY FLUID CULTURE     Status: None   Collection Time    03/18/14 10:10 AM      Result Value Ref Range Status   Specimen Description FLUID RIGHT PLEURAL   Final   Special Requests Normal   Final   Gram Stain     Final   Value: RARE WBC PRESENT, PREDOMINANTLY MONONUCLEAR     NO ORGANISMS SEEN     Performed at [facility]   Culture     Final   Value: NO GROWTH 2 DAYS     Performed at [facility]   Report Status PENDING   Incomplete     Labs: Basic Metabolic Panel:  Recent Labs Lab 03/16/14 1309 03/17/14 0549 03/18/14 0658 03/19/14 0645  NA 135* 138 141 142  K 2.9* 3.2* 3.6* 4.3  CL 95* 98 102 105  CO2 26 23 25 23   GLUCOSE 105* 207* 84 162*  BUN 50* 44* 41* 43*  CREATININE 2.07* 1.97* 1.76* 1.81*  CALCIUM 7.9* 7.3* 8.1* 8.4  MG  --  2.0  --   --    Liver Function Tests:  Recent Labs Lab 03/16/14 1309 03/17/14 0549  AST 22 28  ALT 33 33  ALKPHOS 98 101  BILITOT 0.8 0.9  PROT 6.4 5.4*  ALBUMIN 2.5* 2.3*   CBC:  Recent Labs Lab 03/16/14  1309 03/17/14 0549  WBC 6.7 5.7  NEUTROABS 4.7  --   HGB 10.9* 10.3*  HCT 31.7* 30.0*  MCV 91.1 90.6  PLT 169 189   BNP: BNP (last 3 results)  Recent Labs  03/16/14 1309  PROBNP  3251.0*   CBG:  Recent Labs Lab 03/19/14 1146 03/19/14 1658 03/19/14 2044 03/20/14 0749 03/20/14 1231  GLUCAP 171* 169* 98 126* 112*       Signed:  Conley Canal [phone]  [facility] 03/20/2014, 2:45 PM

## 2014-03-20 NOTE — Progress Notes (Signed)
Nsg Discharge Note  Admit Date:  03/16/2014 Discharge date: 03/20/2014   Karren CobbleSamuel H Ensz to be D/C'd Home per MD order.  AVS completed.  Copy for chart, and copy for patient signed, and dated. Patient/caregiver able to verbalize understanding.  Discharge Medication:   Medication List    STOP taking these medications       DULoxetine 60 MG capsule  Commonly known as:  CYMBALTA      TAKE these medications       ADVAIR DISKUS 250-50 MCG/DOSE Aepb  Generic drug:  Fluticasone-Salmeterol  Inhale 1 puff into the lungs every 12 (twelve) hours.     allopurinol 100 MG tablet  Commonly known as:  ZYLOPRIM  Take 100 mg by mouth every morning.     amLODipine 10 MG tablet  Commonly known as:  NORVASC  Take 10 mg by mouth daily.     aspirin EC 81 MG tablet  Take 81 mg by mouth daily.     calcitRIOL 0.25 MCG capsule  Commonly known as:  ROCALTROL  Take 0.25 mcg by mouth daily.     citalopram 20 MG tablet  Commonly known as:  CELEXA  Take 3 tablets (60 mg total) by mouth daily.     furosemide 40 MG tablet  Commonly known as:  LASIX  Take 40-80 mg by mouth 2 (two) times daily. Take 2 tablets (80 mg) in the morning and 1 tablet (40 mg) in the evening.  May take an extra dose in the evening for increased swelling.     hydrALAZINE 25 MG tablet  Commonly known as:  APRESOLINE  Take 25 mg by mouth 3 (three) times daily.     insulin NPH Human 100 UNIT/ML injection  Commonly known as:  HUMULIN N,NOVOLIN N  Inject 0.55 mLs (55 Units total) into the skin 2 (two) times daily before a meal.     insulin regular 100 units/mL injection  Commonly known as:  NOVOLIN R,HUMULIN R  Inject 20 Units into the skin 3 (three) times daily before meals.     levofloxacin 750 MG tablet  Commonly known as:  LEVAQUIN  Take 1 tablet (750 mg total) by mouth every other day.  Start taking on:  03/22/2014     metolazone 5 MG tablet  Commonly known as:  ZAROXOLYN  Take 5 mg by mouth daily.     omega-3 acid  ethyl esters 1 G capsule  Commonly known as:  LOVAZA  Take 1 g by mouth 2 (two) times daily.     potassium chloride SA 20 MEQ tablet  Commonly known as:  K-DUR,KLOR-CON  Take 20 mEq by mouth 2 (two) times daily.     propranolol 10 MG tablet  Commonly known as:  INDERAL  Take 10 mg by mouth 2 (two) times daily.     rosuvastatin 10 MG tablet  Commonly known as:  CRESTOR  Take 10 mg by mouth daily.     tamsulosin 0.4 MG Caps capsule  Commonly known as:  FLOMAX  Take 0.4 mg by mouth daily.     tiotropium 18 MCG inhalation capsule  Commonly known as:  SPIRIVA  Place 18 mcg into inhaler and inhale daily.     traZODone 100 MG tablet  Commonly known as:  DESYREL  Take 1 tablet (100 mg total) by mouth at bedtime.     Vitamin D (Ergocalciferol) 50000 UNITS Caps capsule  Commonly known as:  DRISDOL  Take 50,000 Units by mouth every Thursday.  Discharge Assessment: Filed Vitals:   03/20/14 1153  BP:   Pulse: 80  Temp:   Resp: 18   Skin clean, dry and intact without evidence of skin break down, no evidence of skin tears noted. IV catheter discontinued intact. Site without signs and symptoms of complications - no redness or edema noted at insertion site, patient denies c/o pain - only slight tenderness at site.  Dressing with slight pressure applied.  D/c Instructions-Education: Discharge instructions given to patient/family with verbalized understanding. D/c education completed with patient/family including follow up instructions, medication list, d/c activities limitations if indicated, with other d/c instructions as indicated by MD - patient able to verbalize understanding, all questions fully answered. Patient instructed to return to ED, call 911, or call MD for any changes in condition.  Patient escorted via WC, and D/C home via private auto.  Kern Reap, RN 03/20/2014 2:42 PM

## 2014-03-20 NOTE — Discharge Summary (Signed)
Addendum  Patient seen and examined, chart and data base reviewed.  I agree with the above assessment and plan.  For full details please see Mrs. Algis DownsMarianne York PA note.  Healthcare associated pneumonia, associated with pleural effusion.  Status post right sided thoracentesis with removal of 200 she she of fluids, cytology showed reactive mesothelial cells.  Discharged home on levofloxacin.   Clint LippsMutaz A Raiden Yearwood, MD [facility] Pager: [phone] 03/20/2014, 5:39 PM

## 2014-03-21 ENCOUNTER — Emergency Department (HOSPITAL_COMMUNITY): Payer: No Typology Code available for payment source

## 2014-03-21 ENCOUNTER — Other Ambulatory Visit: Payer: Self-pay

## 2014-03-21 ENCOUNTER — Inpatient Hospital Stay (HOSPITAL_COMMUNITY)
Admission: EM | Admit: 2014-03-21 | Discharge: 2014-03-23 | DRG: 088 | Disposition: A | Discharge status: Nursing Home (Includes ICF) | Payer: No Typology Code available for payment source | Attending: Internal Medicine | Admitting: Internal Medicine

## 2014-03-21 ENCOUNTER — Encounter (HOSPITAL_COMMUNITY): Payer: Self-pay | Admitting: Emergency Medicine

## 2014-03-21 DIAGNOSIS — G8929 Other chronic pain: Secondary | ICD-10-CM

## 2014-03-21 DIAGNOSIS — N179 Acute kidney failure, unspecified: Secondary | ICD-10-CM | POA: Diagnosis present

## 2014-03-21 DIAGNOSIS — T424X5A Adverse effect of benzodiazepines, initial encounter: Secondary | ICD-10-CM | POA: Diagnosis present

## 2014-03-21 DIAGNOSIS — S93409A Sprain of unspecified ligament of unspecified ankle, initial encounter: Secondary | ICD-10-CM | POA: Diagnosis present

## 2014-03-21 DIAGNOSIS — N183 Chronic kidney disease, stage 3 unspecified: Secondary | ICD-10-CM | POA: Diagnosis present

## 2014-03-21 DIAGNOSIS — T40605A Adverse effect of unspecified narcotics, initial encounter: Secondary | ICD-10-CM | POA: Diagnosis present

## 2014-03-21 DIAGNOSIS — J449 Chronic obstructive pulmonary disease, unspecified: Secondary | ICD-10-CM | POA: Diagnosis present

## 2014-03-21 DIAGNOSIS — E785 Hyperlipidemia, unspecified: Secondary | ICD-10-CM

## 2014-03-21 DIAGNOSIS — I129 Hypertensive chronic kidney disease with stage 1 through stage 4 chronic kidney disease, or unspecified chronic kidney disease: Secondary | ICD-10-CM | POA: Diagnosis present

## 2014-03-21 DIAGNOSIS — E441 Mild protein-calorie malnutrition: Secondary | ICD-10-CM | POA: Diagnosis present

## 2014-03-21 DIAGNOSIS — E1129 Type 2 diabetes mellitus with other diabetic kidney complication: Secondary | ICD-10-CM | POA: Diagnosis present

## 2014-03-21 DIAGNOSIS — J189 Pneumonia, unspecified organism: Secondary | ICD-10-CM | POA: Diagnosis present

## 2014-03-21 DIAGNOSIS — E871 Hypo-osmolality and hyponatremia: Secondary | ICD-10-CM

## 2014-03-21 DIAGNOSIS — R42 Dizziness and giddiness: Secondary | ICD-10-CM | POA: Diagnosis present

## 2014-03-21 DIAGNOSIS — E1165 Type 2 diabetes mellitus with hyperglycemia: Secondary | ICD-10-CM | POA: Diagnosis present

## 2014-03-21 DIAGNOSIS — Z87891 Personal history of nicotine dependence: Secondary | ICD-10-CM | POA: Diagnosis not present

## 2014-03-21 DIAGNOSIS — Z794 Long term (current) use of insulin: Secondary | ICD-10-CM | POA: Diagnosis not present

## 2014-03-21 DIAGNOSIS — Z800 Family history of malignant neoplasm of digestive organs: Secondary | ICD-10-CM

## 2014-03-21 DIAGNOSIS — S060X0A Concussion without loss of consciousness, initial encounter: Secondary | ICD-10-CM | POA: Diagnosis present

## 2014-03-21 DIAGNOSIS — J9600 Acute respiratory failure, unspecified whether with hypoxia or hypercapnia: Secondary | ICD-10-CM | POA: Diagnosis present

## 2014-03-21 DIAGNOSIS — N058 Unspecified nephritic syndrome with other morphologic changes: Secondary | ICD-10-CM | POA: Diagnosis present

## 2014-03-21 DIAGNOSIS — D509 Iron deficiency anemia, unspecified: Secondary | ICD-10-CM | POA: Diagnosis present

## 2014-03-21 DIAGNOSIS — IMO0001 Reserved for inherently not codable concepts without codable children: Secondary | ICD-10-CM | POA: Diagnosis present

## 2014-03-21 DIAGNOSIS — R079 Chest pain, unspecified: Secondary | ICD-10-CM

## 2014-03-21 DIAGNOSIS — Z7982 Long term (current) use of aspirin: Secondary | ICD-10-CM | POA: Diagnosis not present

## 2014-03-21 DIAGNOSIS — R0989 Other specified symptoms and signs involving the circulatory and respiratory systems: Secondary | ICD-10-CM

## 2014-03-21 DIAGNOSIS — E875 Hyperkalemia: Secondary | ICD-10-CM

## 2014-03-21 DIAGNOSIS — S73101A Unspecified sprain of right hip, initial encounter: Secondary | ICD-10-CM

## 2014-03-21 DIAGNOSIS — R0600 Dyspnea, unspecified: Secondary | ICD-10-CM

## 2014-03-21 DIAGNOSIS — F17200 Nicotine dependence, unspecified, uncomplicated: Secondary | ICD-10-CM

## 2014-03-21 DIAGNOSIS — N19 Unspecified kidney failure: Secondary | ICD-10-CM

## 2014-03-21 DIAGNOSIS — F329 Major depressive disorder, single episode, unspecified: Secondary | ICD-10-CM

## 2014-03-21 DIAGNOSIS — G479 Sleep disorder, unspecified: Secondary | ICD-10-CM

## 2014-03-21 DIAGNOSIS — I5032 Chronic diastolic (congestive) heart failure: Secondary | ICD-10-CM | POA: Diagnosis present

## 2014-03-21 DIAGNOSIS — S61509A Unspecified open wound of unspecified wrist, initial encounter: Secondary | ICD-10-CM | POA: Diagnosis present

## 2014-03-21 DIAGNOSIS — E1121 Type 2 diabetes mellitus with diabetic nephropathy: Secondary | ICD-10-CM | POA: Diagnosis present

## 2014-03-21 DIAGNOSIS — E1149 Type 2 diabetes mellitus with other diabetic neurological complication: Secondary | ICD-10-CM | POA: Diagnosis present

## 2014-03-21 DIAGNOSIS — I739 Peripheral vascular disease, unspecified: Secondary | ICD-10-CM

## 2014-03-21 DIAGNOSIS — J4489 Other specified chronic obstructive pulmonary disease: Secondary | ICD-10-CM | POA: Diagnosis present

## 2014-03-21 DIAGNOSIS — S93401A Sprain of unspecified ligament of right ankle, initial encounter: Secondary | ICD-10-CM

## 2014-03-21 DIAGNOSIS — E7800 Pure hypercholesterolemia, unspecified: Secondary | ICD-10-CM

## 2014-03-21 DIAGNOSIS — Y92410 Unspecified street and highway as the place of occurrence of the external cause: Secondary | ICD-10-CM | POA: Diagnosis not present

## 2014-03-21 DIAGNOSIS — G4733 Obstructive sleep apnea (adult) (pediatric): Secondary | ICD-10-CM

## 2014-03-21 LAB — BASIC METABOLIC PANEL
BUN: 39 mg/dL — AB (ref 6–23)
CALCIUM: 9 mg/dL (ref 8.4–10.5)
CO2: 24 mEq/L (ref 19–32)
Chloride: 99 mEq/L (ref 96–112)
Creatinine, Ser: 2.11 mg/dL — ABNORMAL HIGH (ref 0.50–1.35)
GFR calc Af Amer: 34 mL/min — ABNORMAL LOW (ref >90)
GFR calc non Af Amer: 29 mL/min — ABNORMAL LOW (ref >90)
GLUCOSE: 170 mg/dL — AB (ref 70–99)
Potassium: 4.9 mEq/L (ref 3.7–5.3)
Sodium: 137 mEq/L (ref 137–147)

## 2014-03-21 LAB — CBC WITH DIFFERENTIAL/PLATELET
Basophils Absolute: 0 10*3/uL (ref 0.0–0.1)
Basophils Relative: 0 % (ref 0–1)
EOS PCT: 2 % (ref 0–5)
Eosinophils Absolute: 0.1 10*3/uL (ref 0.0–0.7)
HCT: 29.9 % — ABNORMAL LOW (ref 39.0–52.0)
HEMOGLOBIN: 10 g/dL — AB (ref 13.0–17.0)
Lymphocytes Relative: 13 % (ref 12–46)
Lymphs Abs: 0.7 10*3/uL (ref 0.7–4.0)
MCH: 30.8 pg (ref 26.0–34.0)
MCHC: 33.4 g/dL (ref 30.0–36.0)
MCV: 92 fL (ref 78.0–100.0)
MONOS PCT: 12 % (ref 3–12)
Monocytes Absolute: 0.6 10*3/uL (ref 0.1–1.0)
Neutro Abs: 3.8 10*3/uL (ref 1.7–7.7)
Neutrophils Relative %: 73 % (ref 43–77)
PLATELETS: 276 10*3/uL (ref 150–400)
RBC: 3.25 MIL/uL — AB (ref 4.22–5.81)
RDW: 14.6 % (ref 11.5–15.5)
WBC: 5.2 10*3/uL (ref 4.0–10.5)

## 2014-03-21 LAB — BODY FLUID CULTURE
Culture: NO GROWTH
SPECIAL REQUESTS: NORMAL

## 2014-03-21 MED ORDER — DIAZEPAM 5 MG/ML IJ SOLN
2.5000 mg | Freq: Once | INTRAMUSCULAR | Status: AC
  Administered 2014-03-21: 2.5 mg via INTRAVENOUS
  Filled 2014-03-21: qty 2

## 2014-03-21 MED ORDER — MORPHINE SULFATE 4 MG/ML IJ SOLN
4.0000 mg | Freq: Once | INTRAMUSCULAR | Status: AC
  Administered 2014-03-21 (×2): 4 mg via INTRAVENOUS
  Filled 2014-03-21: qty 1

## 2014-03-21 MED ORDER — MORPHINE SULFATE 4 MG/ML IJ SOLN: 4.0000 mg | Freq: Once | INTRAMUSCULAR | Status: DC

## 2014-03-21 MED ORDER — DIAZEPAM 5 MG/ML IJ SOLN
INTRAMUSCULAR | Status: AC
  Filled 2014-03-21: qty 2

## 2014-03-21 MED ORDER — MORPHINE SULFATE 4 MG/ML IJ SOLN
INTRAMUSCULAR | Status: AC
  Administered 2014-03-21: 4 mg via INTRAVENOUS
  Filled 2014-03-21: qty 1

## 2014-03-21 MED ORDER — DIAZEPAM 5 MG/ML IJ SOLN
5.0000 mg | Freq: Once | INTRAMUSCULAR | Status: AC
  Administered 2014-03-21: 5 mg via INTRAVENOUS
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
## 2014-03-22 DIAGNOSIS — E1165 Type 2 diabetes mellitus with hyperglycemia: Secondary | ICD-10-CM

## 2014-03-22 DIAGNOSIS — N179 Acute kidney failure, unspecified: Secondary | ICD-10-CM

## 2014-03-22 DIAGNOSIS — N183 Chronic kidney disease, stage 3 unspecified: Secondary | ICD-10-CM

## 2014-03-22 DIAGNOSIS — E1121 Type 2 diabetes mellitus with diabetic nephropathy: Secondary | ICD-10-CM | POA: Diagnosis present

## 2014-03-22 DIAGNOSIS — S060X0A Concussion without loss of consciousness, initial encounter: Secondary | ICD-10-CM

## 2014-03-22 DIAGNOSIS — E1149 Type 2 diabetes mellitus with other diabetic neurological complication: Secondary | ICD-10-CM

## 2014-03-22 DIAGNOSIS — IMO0001 Reserved for inherently not codable concepts without codable children: Secondary | ICD-10-CM | POA: Diagnosis present

## 2014-03-22 LAB — CULTURE, BLOOD (ROUTINE X 2)
CULTURE: NO GROWTH
Culture: NO GROWTH

## 2014-03-22 LAB — GLUCOSE, CAPILLARY
GLUCOSE-CAPILLARY: 137 mg/dL — AB (ref 70–99)
GLUCOSE-CAPILLARY: 192 mg/dL — AB (ref 70–99)
Glucose-Capillary: 169 mg/dL — ABNORMAL HIGH (ref 70–99)
Glucose-Capillary: 202 mg/dL — ABNORMAL HIGH (ref 70–99)
Glucose-Capillary: 227 mg/dL — ABNORMAL HIGH (ref 70–99)

## 2014-03-22 LAB — COMPREHENSIVE METABOLIC PANEL
ALT: 46 U/L (ref 0–53)
AST: 34 U/L (ref 0–37)
Albumin: 2.2 g/dL — ABNORMAL LOW (ref 3.5–5.2)
Alkaline Phosphatase: 108 U/L (ref 39–117)
BILIRUBIN TOTAL: 0.3 mg/dL (ref 0.3–1.2)
BUN: 36 mg/dL — ABNORMAL HIGH (ref 6–23)
CALCIUM: 8.5 mg/dL (ref 8.4–10.5)
CO2: 24 meq/L (ref 19–32)
CREATININE: 1.94 mg/dL — AB (ref 0.50–1.35)
Chloride: 104 mEq/L (ref 96–112)
GFR calc Af Amer: 37 mL/min — ABNORMAL LOW (ref >90)
GFR, EST NON AFRICAN AMERICAN: 32 mL/min — AB (ref >90)
Glucose, Bld: 160 mg/dL — ABNORMAL HIGH (ref 70–99)
Potassium: 4.8 mEq/L (ref 3.7–5.3)
Sodium: 140 mEq/L (ref 137–147)
Total Protein: 6.2 g/dL (ref 6.0–8.3)

## 2014-03-22 MED ORDER — MORPHINE SULFATE 2 MG/ML IJ SOLN: 2.0000 mg | INTRAMUSCULAR | Status: DC | PRN

## 2014-03-22 MED ORDER — TAMSULOSIN HCL 0.4 MG PO CAPS
0.4000 mg | ORAL_CAPSULE | Freq: Every day | ORAL | Status: DC
  Administered 2014-03-22 – 2014-03-23 (×2): 0.4 mg via ORAL
  Filled 2014-03-22 (×2): qty 1

## 2014-03-22 MED ORDER — BUDESONIDE-FORMOTEROL FUMARATE 80-4.5 MCG/ACT IN AERO
2.0000 | INHALATION_SPRAY | Freq: Two times a day (BID) | RESPIRATORY_TRACT | Status: DC
  Administered 2014-03-22 – 2014-03-23 (×2): 2 via RESPIRATORY_TRACT
  Filled 2014-03-22: qty 6.9

## 2014-03-22 MED ORDER — INSULIN NPH (HUMAN) (ISOPHANE) 100 UNIT/ML ~~LOC~~ SUSP
55.0000 [IU] | Freq: Two times a day (BID) | SUBCUTANEOUS | Status: DC
  Administered 2014-03-22 – 2014-03-23 (×3): 55 [IU] via SUBCUTANEOUS
  Filled 2014-03-22: qty 10

## 2014-03-22 MED ORDER — TRAZODONE HCL 50 MG PO TABS
100.0000 mg | ORAL_TABLET | Freq: Every day | ORAL | Status: DC
  Administered 2014-03-22 (×2): 100 mg via ORAL
  Filled 2014-03-22 (×2): qty 2

## 2014-03-22 MED ORDER — AMLODIPINE BESYLATE 5 MG PO TABS
10.0000 mg | ORAL_TABLET | Freq: Every day | ORAL | Status: DC
  Administered 2014-03-22 – 2014-03-23 (×2): 10 mg via ORAL
  Filled 2014-03-22 (×2): qty 2

## 2014-03-22 MED ORDER — SODIUM CHLORIDE 0.9 % IV SOLN
INTRAVENOUS | Status: DC
  Administered 2014-03-22: 01:00:00 via INTRAVENOUS

## 2014-03-22 MED ORDER — POTASSIUM CHLORIDE CRYS ER 20 MEQ PO TBCR
20.0000 meq | EXTENDED_RELEASE_TABLET | Freq: Two times a day (BID) | ORAL | Status: DC
  Administered 2014-03-22 – 2014-03-23 (×4): 20 meq via ORAL
  Filled 2014-03-22 (×4): qty 1

## 2014-03-22 MED ORDER — PROPRANOLOL HCL 20 MG PO TABS
10.0000 mg | ORAL_TABLET | Freq: Two times a day (BID) | ORAL | Status: DC
  Administered 2014-03-22 – 2014-03-23 (×4): 10 mg via ORAL
  Filled 2014-03-22 (×4): qty 1

## 2014-03-22 MED ORDER — IBUPROFEN 800 MG PO TABS: 400.0000 mg | ORAL_TABLET | ORAL | Status: DC | PRN

## 2014-03-22 MED ORDER — CITALOPRAM HYDROBROMIDE 20 MG PO TABS
60.0000 mg | ORAL_TABLET | Freq: Every day | ORAL | Status: DC
  Administered 2014-03-22 – 2014-03-23 (×2): 60 mg via ORAL
  Filled 2014-03-22 (×2): qty 3

## 2014-03-22 MED ORDER — ATORVASTATIN CALCIUM 20 MG PO TABS
20.0000 mg | ORAL_TABLET | Freq: Every day | ORAL | Status: DC
  Administered 2014-03-22 (×2): 20 mg via ORAL
  Filled 2014-03-22 (×2): qty 1

## 2014-03-22 MED ORDER — OMEGA-3-ACID ETHYL ESTERS 1 G PO CAPS
1.0000 g | ORAL_CAPSULE | Freq: Two times a day (BID) | ORAL | Status: DC
  Administered 2014-03-22 – 2014-03-23 (×4): 1 g via ORAL
  Filled 2014-03-22 (×4): qty 1

## 2014-03-22 MED ORDER — TIOTROPIUM BROMIDE MONOHYDRATE 18 MCG IN CAPS
18.0000 ug | ORAL_CAPSULE | Freq: Every day | RESPIRATORY_TRACT | Status: DC
  Administered 2014-03-22 – 2014-03-23 (×2): 18 ug via RESPIRATORY_TRACT
  Filled 2014-03-22: qty 5

## 2014-03-22 MED ORDER — INSULIN ASPART 100 UNIT/ML ~~LOC~~ SOLN
0.0000 [IU] | Freq: Three times a day (TID) | SUBCUTANEOUS | Status: DC
Start: 2014-03-22 — End: 2014-03-23
  Administered 2014-03-22: 1 [IU] via SUBCUTANEOUS
  Administered 2014-03-22: 2 [IU] via SUBCUTANEOUS
  Administered 2014-03-22: 3 [IU] via SUBCUTANEOUS
  Administered 2014-03-23: 2 [IU] via SUBCUTANEOUS

## 2014-03-22 MED ORDER — ALBUTEROL SULFATE (2.5 MG/3ML) 0.083% IN NEBU
2.5000 mg | INHALATION_SOLUTION | RESPIRATORY_TRACT | Status: DC
  Administered 2014-03-22 – 2014-03-23 (×4): 2.5 mg via RESPIRATORY_TRACT
  Filled 2014-03-22 (×4): qty 3

## 2014-03-22 MED ORDER — HYDROCODONE-ACETAMINOPHEN 5-325 MG PO TABS: 1.0000 | ORAL_TABLET | ORAL | Status: DC | PRN

## 2014-03-22 MED ORDER — ONDANSETRON HCL 4 MG PO TABS: 4.0000 mg | ORAL_TABLET | Freq: Four times a day (QID) | ORAL | Status: DC | PRN

## 2014-03-22 MED ORDER — HYDROCODONE-ACETAMINOPHEN 5-325 MG PO TABS
1.0000 | ORAL_TABLET | Freq: Four times a day (QID) | ORAL | Status: DC | PRN
Start: 2014-03-22 — End: 2014-03-23

## 2014-03-22 MED ORDER — LEVOFLOXACIN 750 MG PO TABS
750.0000 mg | ORAL_TABLET | ORAL | Status: DC
  Administered 2014-03-22: 750 mg via ORAL
  Filled 2014-03-22: qty 1

## 2014-03-22 MED ORDER — CALCITRIOL 0.25 MCG PO CAPS
0.2500 ug | ORAL_CAPSULE | Freq: Every day | ORAL | Status: DC
  Administered 2014-03-22 – 2014-03-23 (×2): 0.25 ug via ORAL
  Filled 2014-03-22 (×2): qty 1

## 2014-03-22 MED ORDER — ONDANSETRON HCL 4 MG/2ML IJ SOLN: 4.0000 mg | Freq: Four times a day (QID) | INTRAMUSCULAR | Status: DC | PRN

## 2014-03-22 MED ORDER — ALLOPURINOL 100 MG PO TABS
100.0000 mg | ORAL_TABLET | Freq: Every morning | ORAL | Status: DC
  Administered 2014-03-22 – 2014-03-23 (×2): 100 mg via ORAL
  Filled 2014-03-22 (×2): qty 1

## 2014-03-22 MED ORDER — ENOXAPARIN SODIUM 40 MG/0.4ML ~~LOC~~ SOLN
40.0000 mg | SUBCUTANEOUS | Status: DC
  Administered 2014-03-22 – 2014-03-23 (×2): 40 mg via SUBCUTANEOUS
  Filled 2014-03-22 (×2): qty 0.4

## 2014-03-22 MED ORDER — ASPIRIN EC 81 MG PO TBEC
81.0000 mg | DELAYED_RELEASE_TABLET | Freq: Every day | ORAL | Status: DC
  Administered 2014-03-22 – 2014-03-23 (×2): 81 mg via ORAL
  Filled 2014-03-22 (×2): qty 1

## 2014-03-22 MED ORDER — VITAMIN D (ERGOCALCIFEROL) 1.25 MG (50000 UNIT) PO CAPS: 50000.0000 [IU] | ORAL_CAPSULE | ORAL | Status: DC

## 2014-03-22 MED ORDER — HYDRALAZINE HCL 25 MG PO TABS
25.0000 mg | ORAL_TABLET | Freq: Three times a day (TID) | ORAL | Status: DC
  Administered 2014-03-22 – 2014-03-23 (×4): 25 mg via ORAL
  Filled 2014-03-22 (×5): qty 1

## 2014-03-22 MED ORDER — CYCLOBENZAPRINE HCL 10 MG PO TABS
10.0000 mg | ORAL_TABLET | Freq: Three times a day (TID) | ORAL | Status: DC
  Administered 2014-03-22 (×3): 10 mg via ORAL
  Filled 2014-03-22 (×3): qty 1

## 2014-03-22 MED ORDER — MOMETASONE FURO-FORMOTEROL FUM 100-5 MCG/ACT IN AERO: 2.0000 | INHALATION_SPRAY | Freq: Two times a day (BID) | RESPIRATORY_TRACT | Status: DC

## 2014-03-22 NOTE — Progress Notes (Signed)
UR chart review completed.  
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
## 2014-03-22 NOTE — Clinical Social Work Note (Signed)
CSW spoke w daughter Victorino DikeJennifer, explained PT evaluation that patient can return to [facility] at discharge.  Daughter agreeable, will come to visit patient after 5 PM today and can assist w transport when he is discharged.  CSw signing off as no further SW needs identified.  Santa GeneraAnne Kahli Fitzgerald, LCSW Clinical Social Worker [phone])
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
## 2014-03-22 NOTE — Evaluation (Signed)
Physical Therapy Evaluation Patient Details Name: Douglas CobbleSamuel H Otani MRN: [id number] DOB: May 11, 1936 Today's Date: 03/22/2014   History of Present Illness  78 year old male who   has a past medical history of Hypertension; OSA (obstructive sleep apnea); Chronic kidney disease; Hyperlipidemia; Type 2 diabetes mellitus; COPD (chronic obstructive pulmonary disease); and Chronic diastolic heart failure.  Clinical Impression  Pt is from assistive living and intends on going back.  Pt is I supine to sit, sit to stand.  Pt is able to ambulate with rolling walker x 400 feet which will allow him to return to assistive living.  Pt appears to be at prior level.    Follow Up Recommendations No PT follow up    Equipment Recommendations  Rolling walker with 5" wheels       Precautions / Restrictions Precautions Precautions: None Restrictions Weight Bearing Restrictions: No      Mobility  Bed Mobility Overal bed mobility: Independent       Transfers Overall transfer level: Independent         Ambulation/Gait Ambulation/Gait assistance: Modified independent (Device/Increase time) Ambulation Distance (Feet): 400 Feet Assistive device: Rolling walker (2 wheeled) Gait Pattern/deviations: WFL(Within Functional Limits)   Gait velocity interpretation: at or above normal speed for age/gender              Pertinent Vitals/Pain 0/10 today per pt    Home Living Family/patient expects to be discharged to:: Assisted living               Home Equipment: Gilmer MorCane - single point      Prior Function Level of Independence: Independent with assistive device(s)                  Extremity/Trunk Assessment        Lower Extremity Assessment: Overall WFL for tasks assessed       Communication   Communication: No difficulties  Cognition Arousal/Alertness: Awake/alert   Overall Cognitive Status: Within Functional Limits for tasks assessed                     Assessment/Plan    PT Assessment Patent does not need any further PT services  PT Diagnosis   none  PT Problem List  none  PT Treatment Interventions     PT Goals (Current goals can be found in the Care Plan section) Acute Rehab PT Goals PT Goal Formulation: No goals set, d/c therapy               End of Session Equipment Utilized During Treatment: Gait belt Activity Tolerance: Patient tolerated treatment well Patient left: in chair;with call bell/phone within reach;with chair alarm set           Time: [id number] PT Time Calculation (min): 31 min   Charges:   PT Evaluation $Initial PT Evaluation Tier I: 1 Procedure     PT G Codes:          Bella KennedyCynthia J Russell 03/22/2014, 11:33 AM
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
## 2014-03-23 ENCOUNTER — Inpatient Hospital Stay (HOSPITAL_COMMUNITY): Payer: No Typology Code available for payment source

## 2014-03-23 LAB — GLUCOSE, CAPILLARY
Glucose-Capillary: 98 mg/dL (ref 70–99)
Glucose-Capillary: 166 mg/dL — ABNORMAL HIGH (ref 70–99)

## 2014-03-23 MED ORDER — FUROSEMIDE 10 MG/ML IJ SOLN
40.0000 mg | Freq: Once | INTRAMUSCULAR | Status: AC
  Administered 2014-03-23: 40 mg via INTRAVENOUS
  Filled 2014-03-23: qty 4

## 2014-03-23 MED ORDER — FUROSEMIDE 10 MG/ML IJ SOLN: 40.0000 mg | Freq: Two times a day (BID) | INTRAMUSCULAR | Status: DC

## 2014-03-23 MED ORDER — ALBUTEROL SULFATE (2.5 MG/3ML) 0.083% IN NEBU: 2.5000 mg | INHALATION_SOLUTION | RESPIRATORY_TRACT | Status: DC | PRN

## 2014-03-23 MED ORDER — ALBUTEROL SULFATE (2.5 MG/3ML) 0.083% IN NEBU
2.5000 mg | INHALATION_SOLUTION | Freq: Four times a day (QID) | RESPIRATORY_TRACT | Status: DC
  Administered 2014-03-23: 2.5 mg via RESPIRATORY_TRACT
  Filled 2014-03-23: qty 3

## 2014-03-23 MED ORDER — METOLAZONE 5 MG PO TABS
5.0000 mg | ORAL_TABLET | Freq: Every day | ORAL | Status: DC
  Administered 2014-03-23: 5 mg via ORAL
  Filled 2014-03-23: qty 1

## 2014-03-23 NOTE — Progress Notes (Addendum)
Patient states understanding of discharge instructions, package given. Report called to [facility].

## 2014-03-23 NOTE — Progress Notes (Signed)
Patient was D/C'ed back to [facility]. Patient does not require an FL2 to return to [facility]. Please reconsult if further social work needs arise. CSW signing off.   Jetta LoutBailey Morgan, LCSWA Weekend CSW [phone]

## 2014-04-12 ENCOUNTER — Emergency Department (HOSPITAL_COMMUNITY)
Admission: EM | Admit: 2014-04-12 | Discharge: 2014-04-13 | Disposition: A | Discharge status: Home / Self Care | Payer: Medicare Other | Attending: Emergency Medicine | Admitting: Emergency Medicine

## 2014-04-12 ENCOUNTER — Encounter (HOSPITAL_COMMUNITY): Payer: Self-pay | Admitting: Emergency Medicine

## 2014-04-12 ENCOUNTER — Emergency Department (HOSPITAL_COMMUNITY): Payer: Medicare Other

## 2014-04-12 DIAGNOSIS — Z87891 Personal history of nicotine dependence: Secondary | ICD-10-CM | POA: Insufficient documentation

## 2014-04-12 DIAGNOSIS — E785 Hyperlipidemia, unspecified: Secondary | ICD-10-CM | POA: Insufficient documentation

## 2014-04-12 DIAGNOSIS — Z7982 Long term (current) use of aspirin: Secondary | ICD-10-CM | POA: Insufficient documentation

## 2014-04-12 DIAGNOSIS — Z888 Allergy status to other drugs, medicaments and biological substances status: Secondary | ICD-10-CM | POA: Insufficient documentation

## 2014-04-12 DIAGNOSIS — E119 Type 2 diabetes mellitus without complications: Secondary | ICD-10-CM | POA: Insufficient documentation

## 2014-04-12 DIAGNOSIS — J4489 Other specified chronic obstructive pulmonary disease: Secondary | ICD-10-CM | POA: Insufficient documentation

## 2014-04-12 DIAGNOSIS — G4733 Obstructive sleep apnea (adult) (pediatric): Secondary | ICD-10-CM | POA: Insufficient documentation

## 2014-04-12 DIAGNOSIS — N189 Chronic kidney disease, unspecified: Secondary | ICD-10-CM | POA: Insufficient documentation

## 2014-04-12 DIAGNOSIS — R109 Unspecified abdominal pain: Secondary | ICD-10-CM

## 2014-04-12 DIAGNOSIS — I10 Essential (primary) hypertension: Secondary | ICD-10-CM | POA: Insufficient documentation

## 2014-04-12 DIAGNOSIS — J449 Chronic obstructive pulmonary disease, unspecified: Secondary | ICD-10-CM | POA: Insufficient documentation

## 2014-04-12 DIAGNOSIS — K8050 Calculus of bile duct without cholangitis or cholecystitis without obstruction: Secondary | ICD-10-CM | POA: Insufficient documentation

## 2014-04-12 DIAGNOSIS — Z79899 Other long term (current) drug therapy: Secondary | ICD-10-CM | POA: Insufficient documentation

## 2014-04-12 DIAGNOSIS — M549 Dorsalgia, unspecified: Secondary | ICD-10-CM | POA: Insufficient documentation

## 2014-04-12 DIAGNOSIS — I5032 Chronic diastolic (congestive) heart failure: Secondary | ICD-10-CM | POA: Insufficient documentation

## 2014-04-12 DIAGNOSIS — Z794 Long term (current) use of insulin: Secondary | ICD-10-CM | POA: Insufficient documentation

## 2014-04-12 LAB — COMPREHENSIVE METABOLIC PANEL
ALBUMIN: 3.1 g/dL — AB (ref 3.5–5.2)
ALK PHOS: 73 U/L (ref 39–117)
ALT: 22 U/L (ref 0–53)
AST: 22 U/L (ref 0–37)
BUN: 69 mg/dL — ABNORMAL HIGH (ref 6–23)
CO2: 32 mEq/L (ref 19–32)
CREATININE: 2.94 mg/dL — AB (ref 0.50–1.35)
Calcium: 9.3 mg/dL (ref 8.4–10.5)
Chloride: 93 mEq/L — ABNORMAL LOW (ref 96–112)
GFR calc Af Amer: 23 mL/min — ABNORMAL LOW (ref >90)
GFR calc non Af Amer: 19 mL/min — ABNORMAL LOW (ref >90)
Glucose, Bld: 156 mg/dL — ABNORMAL HIGH (ref 70–99)
POTASSIUM: 3.4 meq/L — AB (ref 3.7–5.3)
Sodium: 138 mEq/L (ref 137–147)
Total Bilirubin: 0.5 mg/dL (ref 0.3–1.2)
Total Protein: 6.6 g/dL (ref 6.0–8.3)

## 2014-04-12 LAB — CBC WITH DIFFERENTIAL/PLATELET
Basophils Absolute: 0 10*3/uL (ref 0.0–0.1)
Basophils Relative: 0 % (ref 0–1)
Eosinophils Absolute: 0.2 10*3/uL (ref 0.0–0.7)
Eosinophils Relative: 2 % (ref 0–5)
HCT: 32.7 % — ABNORMAL LOW (ref 39.0–52.0)
HEMOGLOBIN: 11 g/dL — AB (ref 13.0–17.0)
Lymphocytes Relative: 12 % (ref 12–46)
Lymphs Abs: 0.8 10*3/uL (ref 0.7–4.0)
MCH: 31.4 pg (ref 26.0–34.0)
MCHC: 33.6 g/dL (ref 30.0–36.0)
MCV: 93.4 fL (ref 78.0–100.0)
MONOS PCT: 17 % — AB (ref 3–12)
Monocytes Absolute: 1.2 10*3/uL — ABNORMAL HIGH (ref 0.1–1.0)
NEUTROS PCT: 69 % (ref 43–77)
Neutro Abs: 4.8 10*3/uL (ref 1.7–7.7)
PLATELETS: 151 10*3/uL (ref 150–400)
RBC: 3.5 MIL/uL — ABNORMAL LOW (ref 4.22–5.81)
RDW: 14.3 % (ref 11.5–15.5)
WBC: 6.9 10*3/uL (ref 4.0–10.5)

## 2014-04-12 LAB — URINALYSIS, ROUTINE W REFLEX MICROSCOPIC
Bilirubin Urine: NEGATIVE
Glucose, UA: NEGATIVE mg/dL
Hgb urine dipstick: NEGATIVE
Ketones, ur: NEGATIVE mg/dL
LEUKOCYTES UA: NEGATIVE
Nitrite: NEGATIVE
PH: 6 (ref 5.0–8.0)
Protein, ur: 30 mg/dL — AB
Specific Gravity, Urine: 1.01 (ref 1.005–1.030)
Urobilinogen, UA: 0.2 mg/dL (ref 0.0–1.0)

## 2014-04-12 LAB — URINE MICROSCOPIC-ADD ON

## 2014-04-12 LAB — LIPASE, BLOOD: LIPASE: 24 U/L (ref 11–59)

## 2014-04-12 MED ORDER — MORPHINE SULFATE 4 MG/ML IJ SOLN
4.0000 mg | Freq: Once | INTRAMUSCULAR | Status: AC
  Administered 2014-04-12: 4 mg via INTRAVENOUS
  Filled 2014-04-12: qty 1

## 2014-04-12 MED ORDER — OXYCODONE-ACETAMINOPHEN 5-325 MG PO TABS: 1.0000 | ORAL_TABLET | ORAL | Status: DC | PRN

## 2014-04-12 MED ORDER — SODIUM CHLORIDE 0.9 % IV BOLUS (SEPSIS)
500.0000 mL | Freq: Once | INTRAVENOUS | Status: AC
  Administered 2014-04-12: 500 mL via INTRAVENOUS

## 2014-04-12 NOTE — ED Notes (Signed)
Pt uses oxygen at night, reports low saturation in supine position.  Is currently supine with minimal head elevation, sats upper 80's.  Placed on 2L oxygen via [place] for comfort.

## 2014-04-12 NOTE — ED Provider Notes (Signed)
CSN: [id number]     Arrival date & time 04/12/14  2023 History   First MD Initiated Contact with Patient 04/12/14 2053     Chief Complaint  Patient presents with  . Back Pain  . Flank Pain  . Cholelithiasis     (Consider location/radiation/quality/duration/timing/severity/associated sxs/prior Treatment) HPI Comments: Patient is a 78 year old male presents with complaints of pain in the right flank. He denies any injury or trauma. This has been present for the past several days. He states it hurts when he attempts to stand or change position. It is also worse with palpation. He denies any fevers or chills. He denies any chest pain or shortness of breath.  He was seen [facility] ago for similar complaints. He had x-rays performed and they thought he had gallstones. He was discharged with pain medication and followup with his primary Dr. He is not feeling any better and presents here to be evaluated.  Patient is a 78 y.o. male presenting with flank pain. The history is provided by the patient.  Flank Pain This is a new problem. The current episode started 2 days ago. The problem occurs constantly. The problem has not changed since onset.Nothing aggravates the symptoms. Nothing relieves the symptoms. He has tried nothing for the symptoms. The treatment provided no relief.    Past Medical History  Diagnosis Date  . Hypertension   . OSA (obstructive sleep apnea)   . Chronic kidney disease     with proteinuria  . Hyperlipidemia   . Type 2 diabetes mellitus   . COPD (chronic obstructive pulmonary disease)     with exacerbation in the past  . Chronic diastolic heart failure    Past Surgical History  Procedure Laterality Date  . Cardiac catheterization  10/05/2011     normal filling pressures with a wedge of 10 and a PAS of  42.  In the coronary arteriogram, the LAD had a 20% proximal stenosisand the RCA had a 30% mid stenosis with no other disease noted    . Back surgery     . Kidney stone surgery    . Appendectomy    . Hernia repair     Family History  Problem Relation Age of Onset  . Bronchitis Mother   . Colon cancer Father    History  Substance Use Topics  . Smoking status: Former Smoker -- 2.00 packs/day for 65 years    Types: Cigarettes    Quit date: 07/14/2011  . Smokeless tobacco: Never Used  . Alcohol Use: No    Review of Systems  Genitourinary: Positive for flank pain.  All other systems reviewed and are negative.     Allergies  Ambien and Bupropion hcl  Home Medications   Prior to Admission medications   Medication Sig Start Date End Date Taking? Authorizing Provider  allopurinol (ZYLOPRIM) 100 MG tablet Take 100 mg by mouth every morning.   Yes Historical Provider, MD  amLODipine (NORVASC) 10 MG tablet Take 10 mg by mouth daily.   Yes Historical Provider, MD  aspirin EC 81 MG tablet Take 81 mg by mouth daily.     Yes Historical Provider, MD  atorvastatin (LIPITOR) 20 MG tablet Take 20 mg by mouth at bedtime. 02/22/14  Yes Historical Provider, MD  calcitRIOL (ROCALTROL) 0.25 MCG capsule Take 0.25 mcg by mouth daily.   Yes Historical Provider, MD  citalopram (CELEXA) 20 MG tablet Take 60 mg by mouth daily. 03/20/14  Yes Stephani PoliceMarianne L York,  PA-C  ferrous sulfate 325 (65 FE) MG tablet Take 1 tablet (325 mg total) by mouth 2 (two) times daily with a meal. 03/20/14  Yes Tora KindredMarianne L York, PA-C  Fluticasone-Salmeterol (ADVAIR DISKUS) 250-50 MCG/DOSE AEPB Inhale 1 puff into the lungs every 12 (twelve) hours.     Yes Historical Provider, MD  furosemide (LASIX) 40 MG tablet Take 40-80 mg by mouth 2 (two) times daily. Take 2 tablets (80 mg) in the morning and 1 tablet (40 mg) in the evening.  May take an extra dose in the evening for increased swelling.   Yes Historical Provider, MD  hydrALAZINE (APRESOLINE) 25 MG tablet Take 25 mg by mouth 3 (three) times daily.    Yes Historical Provider, MD  ibuprofen (ADVIL,MOTRIN) 400 MG tablet Take 400 mg by  mouth every 4 (four) hours as needed. For pain 03/08/14  Yes Historical Provider, MD  insulin NPH Human (HUMULIN N,NOVOLIN N) 100 UNIT/ML injection Inject 0.55 mLs (55 Units total) into the skin 2 (two) times daily before a meal. 03/20/14  Yes Tora KindredMarianne L York, PA-C  insulin regular (HUMULIN R,NOVOLIN R) 100 units/mL injection Inject 20 Units into the skin 3 (three) times daily before meals.    Yes Historical Provider, MD  metolazone (ZAROXOLYN) 5 MG tablet Take 5 mg by mouth daily.   Yes Historical Provider, MD  omega-3 acid ethyl esters (LOVAZA) 1 G capsule Take 1 g by mouth 2 (two) times daily.   Yes Historical Provider, MD  potassium chloride SA (K-DUR,KLOR-CON) 20 MEQ tablet Take 20 mEq by mouth 2 (two) times daily.   Yes Historical Provider, MD  propranolol (INDERAL) 10 MG tablet Take 10 mg by mouth 2 (two) times daily.   Yes Historical Provider, MD  Tamsulosin HCl (FLOMAX) 0.4 MG CAPS Take 0.4 mg by mouth daily.    Yes Historical Provider, MD  tiotropium (SPIRIVA) 18 MCG inhalation capsule Place 18 mcg into inhaler and inhale daily.     Yes Historical Provider, MD  traZODone (DESYREL) 100 MG tablet Take 1 tablet (100 mg total) by mouth at bedtime. 03/20/14  Yes Marianne L York, PA-C  Vitamin D, Ergocalciferol, (DRISDOL) 50000 UNITS CAPS capsule Take 50,000 Units by mouth every Thursday.   Yes Historical Provider, MD   BP 141/55  Pulse 74  Temp(Src) 98.6 F (37 C) (Oral)  Resp 17  Ht 6' (1.829 m)  Wt 230 lb (104.327 kg)  BMI 31.19 kg/m2  SpO2 93% Physical Exam  Nursing note and vitals reviewed. Constitutional: He is oriented to person, place, and time. He appears well-developed and well-nourished. No distress.  HENT:  Head: Normocephalic and atraumatic.  Neck: Normal range of motion. Neck supple.  Cardiovascular: Normal rate, regular rhythm and normal heart sounds.   No murmur heard. Pulmonary/Chest: Effort normal and breath sounds normal. No respiratory distress. He has no wheezes.   Abdominal: Soft. Bowel sounds are normal. He exhibits no distension. There is no tenderness.  There is exquisite tenderness to palpation in the area of the right flank. There is no palpable abnormality.  Musculoskeletal: Normal range of motion. He exhibits no edema.  Neurological: He is alert and oriented to person, place, and time.  Skin: Skin is warm and dry. He is not diaphoretic.    ED Course  Procedures (including critical care time) Labs Review Labs Reviewed  CBC WITH DIFFERENTIAL - Abnormal; Notable for the following:    RBC 3.50 (*)    Hemoglobin 11.0 (*)    HCT  32.7 (*)    Monocytes Relative 17 (*)    Monocytes Absolute 1.2 (*)    All other components within normal limits  COMPREHENSIVE METABOLIC PANEL - Abnormal; Notable for the following:    Potassium 3.4 (*)    Chloride 93 (*)    Glucose, Bld 156 (*)    BUN 69 (*)    Creatinine, Ser 2.94 (*)    Albumin 3.1 (*)    GFR calc non Af Amer 19 (*)    GFR calc Af Amer 23 (*)    All other components within normal limits  URINALYSIS, ROUTINE W REFLEX MICROSCOPIC - Abnormal; Notable for the following:    Protein, ur 30 (*)    All other components within normal limits  URINE MICROSCOPIC-ADD ON - Abnormal; Notable for the following:    Squamous Epithelial / LPF FEW (*)    Casts HYALINE CASTS (*)    All other components within normal limits  LIPASE, BLOOD    Imaging Review Ct Abdomen Pelvis Wo Contrast  04/12/2014   CLINICAL DATA:  Seen [facility] 2 days ago with diagnosis of gallstones. Presents today with extreme pain in the right flank.  EXAM: CT ABDOMEN AND PELVIS WITHOUT CONTRAST  TECHNIQUE: Multidetector CT imaging of the abdomen and pelvis was performed following the standard protocol without intravenous contrast.  COMPARISON:  US THORACENTESIS ASP PLEURAL SPACE W/IMG GUIDE dated 03/18/2014; CT CHEST W/O CM dated 03/16/2014  FINDINGS: Atelectasis in the lung bases. Coronary artery and aortic valve  calcifications. Bilateral renal parenchymal atrophy, greater on the left. No pyelocaliectasis or ureterectasis is demonstrated. No renal, ureteral, or bladder stones. The bladder wall is not thickened.  Cholelithiasis with a calcified gallstone demonstrated. No inflammatory changes demonstrated around the gallbladder. The unenhanced appearance of the liver, spleen, pancreas, adrenal glands, inferior vena cava, and retroperitoneal lymph nodes is unremarkable. Calcification of abdominal aorta without aneurysm. Stomach, small bowel, and colon are not abnormally distended. No free air or free fluid in the abdomen. Abdominal wall musculature appears intact.  Pelvis: Surgical absence of the appendix. Prostate gland is not enlarged. No pelvic mass or lymphadenopathy. No free or loculated pelvic fluid collections. The degenerative changes in the lumbar spine. No destructive bone lesions appreciated.  IMPRESSION: No renal or ureteral stone or obstruction.  Cholelithiasis.   Electronically Signed   By: Burman Nieves M.D.   On: 04/12/2014 21:59     EKG Interpretation None      MDM   Final diagnoses:  None    Patient presents here with complaints of severe right flank pain. He is exquisitely tender to the touch, however I see or feel no obvious abnormalities. His urinalysis does not reflect infection and CT scan does not show any renal calculi or obstructive uropathy. He does have a calcified gallstone however no findings consistent with cholecystitis and he is not tender to palpation in the right upper quadrant. I believe this discomfort is most likely musculoskeletal in nature. She will be treated with pain medication and when necessary followup with his primary Dr. if not improving.    Geoffery Lyons, MD 04/12/14 [phone]

## 2014-04-12 NOTE — Discharge Instructions (Signed)
Percocet as needed for pain.  Followup with your primary Dr. if not improving in the next several days.  Return to the ER if you develop high fever, worsening pain, or other new and concerning symptoms.   Flank Pain Flank pain refers to pain that is located on the side of the body between the upper abdomen and the back. The pain may occur over a short period of time (acute) or may be long-term or reoccurring (chronic). It may be mild or severe. Flank pain can be caused by many things. CAUSES  Some of the more common causes of flank pain include:  Muscle strains.   Muscle spasms.   A disease of your spine (vertebral disk disease).   A lung infection (pneumonia).   Fluid around your lungs (pulmonary edema).   A kidney infection.   Kidney stones.   A very painful skin rash caused by the chickenpox virus (shingles).   Gallbladder disease.  HOME CARE INSTRUCTIONS  Home care will depend on the cause of your pain. In general,  Rest as directed by your caregiver.  Drink enough fluids to keep your urine clear or pale yellow.  Only take over-the-counter or prescription medicines as directed by your caregiver. Some medicines may help relieve the pain.  Tell your caregiver about any changes in your pain.  Follow up with your caregiver as directed. SEEK IMMEDIATE MEDICAL CARE IF:   Your pain is not controlled with medicine.   You have new or worsening symptoms.  Your pain increases.   You have abdominal pain.   You have shortness of breath.   You have persistent nausea or vomiting.   You have swelling in your abdomen.   You feel faint or pass out.   You have blood in your urine.  You have a fever or persistent symptoms for more than 2 3 days.  You have a fever and your symptoms suddenly get worse. MAKE SURE YOU:   Understand these instructions.  Will watch your condition.  Will get help right away if you are not doing well or get  worse. Document Released: 01/20/2006 Document Revised: 08/23/2012 Document Reviewed: 07/13/2012 [facility] Patient Information 2014 [place], [place].

## 2014-04-12 NOTE — ED Notes (Signed)
Was seen at [facility] two days ago, dx with gallstones.  Also has hx back pain and renal disease.  Presents today with extreme pain that increases with movement.  Has been taking prescribed medications without relief.

## 2014-07-22 ENCOUNTER — Encounter (HOSPITAL_COMMUNITY): Payer: Self-pay | Admitting: Emergency Medicine

## 2014-07-22 ENCOUNTER — Emergency Department (HOSPITAL_COMMUNITY)
Admission: EM | Admit: 2014-07-22 | Discharge: 2014-07-22 | Disposition: A | Discharge status: Home / Self Care | Payer: Medicare Other | Attending: Emergency Medicine | Admitting: Emergency Medicine

## 2014-07-22 DIAGNOSIS — R5381 Other malaise: Secondary | ICD-10-CM | POA: Insufficient documentation

## 2014-07-22 DIAGNOSIS — R5383 Other fatigue: Secondary | ICD-10-CM

## 2014-07-22 DIAGNOSIS — Z79899 Other long term (current) drug therapy: Secondary | ICD-10-CM | POA: Insufficient documentation

## 2014-07-22 DIAGNOSIS — Z794 Long term (current) use of insulin: Secondary | ICD-10-CM | POA: Insufficient documentation

## 2014-07-22 DIAGNOSIS — Z87891 Personal history of nicotine dependence: Secondary | ICD-10-CM | POA: Insufficient documentation

## 2014-07-22 DIAGNOSIS — Z7982 Long term (current) use of aspirin: Secondary | ICD-10-CM | POA: Insufficient documentation

## 2014-07-22 DIAGNOSIS — I129 Hypertensive chronic kidney disease with stage 1 through stage 4 chronic kidney disease, or unspecified chronic kidney disease: Secondary | ICD-10-CM | POA: Insufficient documentation

## 2014-07-22 DIAGNOSIS — N189 Chronic kidney disease, unspecified: Secondary | ICD-10-CM | POA: Diagnosis not present

## 2014-07-22 DIAGNOSIS — IMO0001 Reserved for inherently not codable concepts without codable children: Secondary | ICD-10-CM | POA: Insufficient documentation

## 2014-07-22 DIAGNOSIS — E785 Hyperlipidemia, unspecified: Secondary | ICD-10-CM | POA: Diagnosis not present

## 2014-07-22 DIAGNOSIS — J449 Chronic obstructive pulmonary disease, unspecified: Secondary | ICD-10-CM | POA: Diagnosis not present

## 2014-07-22 DIAGNOSIS — Z9889 Other specified postprocedural states: Secondary | ICD-10-CM | POA: Insufficient documentation

## 2014-07-22 DIAGNOSIS — E119 Type 2 diabetes mellitus without complications: Secondary | ICD-10-CM | POA: Diagnosis not present

## 2014-07-22 DIAGNOSIS — I5032 Chronic diastolic (congestive) heart failure: Secondary | ICD-10-CM | POA: Diagnosis not present

## 2014-07-22 DIAGNOSIS — J4489 Other specified chronic obstructive pulmonary disease: Secondary | ICD-10-CM | POA: Insufficient documentation

## 2014-07-22 DIAGNOSIS — M791 Myalgia, unspecified site: Secondary | ICD-10-CM

## 2014-07-22 DIAGNOSIS — IMO0002 Reserved for concepts with insufficient information to code with codable children: Secondary | ICD-10-CM | POA: Insufficient documentation

## 2014-07-22 LAB — CBC WITH DIFFERENTIAL/PLATELET
BASOS PCT: 0 % (ref 0–1)
Basophils Absolute: 0 10*3/uL (ref 0.0–0.1)
Eosinophils Absolute: 0.1 10*3/uL (ref 0.0–0.7)
Eosinophils Relative: 1 % (ref 0–5)
HCT: 29.4 % — ABNORMAL LOW (ref 39.0–52.0)
Hemoglobin: 10 g/dL — ABNORMAL LOW (ref 13.0–17.0)
Lymphocytes Relative: 12 % (ref 12–46)
Lymphs Abs: 0.8 10*3/uL (ref 0.7–4.0)
MCH: 31.9 pg (ref 26.0–34.0)
MCHC: 34 g/dL (ref 30.0–36.0)
MCV: 93.9 fL (ref 78.0–100.0)
Monocytes Absolute: 0.9 10*3/uL (ref 0.1–1.0)
Monocytes Relative: 14 % — ABNORMAL HIGH (ref 3–12)
NEUTROS PCT: 73 % (ref 43–77)
Neutro Abs: 4.6 10*3/uL (ref 1.7–7.7)
Platelets: 151 10*3/uL (ref 150–400)
RBC: 3.13 MIL/uL — ABNORMAL LOW (ref 4.22–5.81)
RDW: 14.3 % (ref 11.5–15.5)
WBC: 6.3 10*3/uL (ref 4.0–10.5)

## 2014-07-22 LAB — BASIC METABOLIC PANEL
Anion gap: 13 (ref 5–15)
BUN: 35 mg/dL — AB (ref 6–23)
CO2: 26 mEq/L (ref 19–32)
Calcium: 8.4 mg/dL (ref 8.4–10.5)
Chloride: 103 mEq/L (ref 96–112)
Creatinine, Ser: 2.27 mg/dL — ABNORMAL HIGH (ref 0.50–1.35)
GFR, EST AFRICAN AMERICAN: 31 mL/min — AB (ref >90)
GFR, EST NON AFRICAN AMERICAN: 26 mL/min — AB (ref >90)
Glucose, Bld: 51 mg/dL — ABNORMAL LOW (ref 70–99)
POTASSIUM: 3.7 meq/L (ref 3.7–5.3)
SODIUM: 142 meq/L (ref 137–147)

## 2014-07-22 LAB — URINALYSIS, ROUTINE W REFLEX MICROSCOPIC
Bilirubin Urine: NEGATIVE
GLUCOSE, UA: NEGATIVE mg/dL
HGB URINE DIPSTICK: NEGATIVE
KETONES UR: NEGATIVE mg/dL
Leukocytes, UA: NEGATIVE
Nitrite: NEGATIVE
PROTEIN: 100 mg/dL — AB
Specific Gravity, Urine: 1.01 (ref 1.005–1.030)
Urobilinogen, UA: 0.2 mg/dL (ref 0.0–1.0)
pH: 7 (ref 5.0–8.0)

## 2014-07-22 LAB — URINE MICROSCOPIC-ADD ON

## 2014-07-22 MED ORDER — SODIUM CHLORIDE 0.9 % IV SOLN
INTRAVENOUS | Status: DC
  Administered 2014-07-22: 18:00:00 via INTRAVENOUS

## 2014-07-22 NOTE — ED Provider Notes (Signed)
CSN: [id number]     Arrival date & time 07/22/14  1642 History   First MD Initiated Contact with Patient 07/22/14 1700     Chief Complaint  Patient presents with  . general weakness      (Consider location/radiation/quality/duration/timing/severity/associated sxs/prior Treatment) HPI  Douglas [name] is a 78 y.o. male he is here for evaluation of aching all over, back, pain, and involuntary leg movements. He was recently seen and treated for restless leg syndrome. No recent fall. He has chronic back pain. He denies fever, chills, nausea, vomiting, weakness, or dizziness. He's taking his usual medications, without relief. There are no other known modifying factors   Past Medical History  Diagnosis Date  . Hypertension   . OSA (obstructive sleep apnea)   . Chronic kidney disease     with proteinuria  . Hyperlipidemia   . Type 2 diabetes mellitus   . COPD (chronic obstructive pulmonary disease)     with exacerbation in the past  . Chronic diastolic heart failure    Past Surgical History  Procedure Laterality Date  . Cardiac catheterization  10/05/2011     normal filling pressures with a wedge of 10 and a PAS of  42.  In the coronary arteriogram, the LAD had a 20% proximal stenosisand the RCA had a 30% mid stenosis with no other disease noted    . Back surgery    . Kidney stone surgery    . Appendectomy    . Hernia repair     Family History  Problem Relation Age of Onset  . Bronchitis Mother   . Colon cancer Father    History  Substance Use Topics  . Smoking status: Former Smoker -- 2.00 packs/day for 65 years    Types: Cigarettes    Quit date: 07/14/2011  . Smokeless tobacco: Never Used  . Alcohol Use: No    Review of Systems  All other systems reviewed and are negative.     Allergies  Ambien; Bupropion hcl; Cymbalta; Lisinopril; Metoprolol; Naproxen; and Norvasc  Home Medications   Prior to Admission medications   Medication Sig Start Date End Date Taking?  Authorizing Provider  allopurinol (ZYLOPRIM) 100 MG tablet Take 100 mg by mouth every morning.   Yes Historical Provider, MD  amLODipine (NORVASC) 10 MG tablet Take 10 mg by mouth daily.   Yes Historical Provider, MD  aspirin EC 81 MG tablet Take 81 mg by mouth daily.     Yes Historical Provider, MD  atorvastatin (LIPITOR) 20 MG tablet Take 20 mg by mouth at bedtime. 02/22/14  Yes Historical Provider, MD  calcitRIOL (ROCALTROL) 0.25 MCG capsule Take 0.25 mcg by mouth daily.   Yes Historical Provider, MD  citalopram (CELEXA) 20 MG tablet Take 60 mg by mouth daily. 03/20/14  Yes Tora Kindred York, PA-C  ferrous sulfate 325 (65 FE) MG tablet Take 1 tablet (325 mg total) by mouth 2 (two) times daily with a meal. 03/20/14  Yes Tora Kindred York, PA-C  Fluticasone-Salmeterol (ADVAIR DISKUS) 250-50 MCG/DOSE AEPB Inhale 1 puff into the lungs every 12 (twelve) hours.     Yes Historical Provider, MD  furosemide (LASIX) 40 MG tablet Take 40-80 mg by mouth 2 (two) times daily. Take 2 tablets (80 mg) in the morning and 1 tablet (40 mg) in the evening.  May take an extra dose in the evening for increased swelling.   Yes Historical Provider, MD  hydrALAZINE (APRESOLINE) 25 MG tablet Take 25 mg by mouth  3 (three) times daily.    Yes Historical Provider, MD  ibuprofen (ADVIL,MOTRIN) 400 MG tablet Take 400 mg by mouth every 4 (four) hours as needed. For pain 03/08/14  Yes Historical Provider, MD  insulin NPH Human (HUMULIN N,NOVOLIN N) 100 UNIT/ML injection Inject 0.55 mLs (55 Units total) into the skin 2 (two) times daily before a meal. 03/20/14  Yes Tora KindredMarianne L York, PA-C  insulin regular (HUMULIN R,NOVOLIN R) 100 units/mL injection Inject 20 Units into the skin 3 (three) times daily before meals.    Yes Historical Provider, MD  metolazone (ZAROXOLYN) 5 MG tablet Take 5 mg by mouth daily.   Yes Historical Provider, MD  omega-3 acid ethyl esters (LOVAZA) 1 G capsule Take 1 g by mouth 2 (two) times daily.   Yes Historical Provider,  MD  potassium chloride SA (K-DUR,KLOR-CON) 20 MEQ tablet Take 20 mEq by mouth 2 (two) times daily.   Yes Historical Provider, MD  propranolol (INDERAL) 10 MG tablet Take 10 mg by mouth 2 (two) times daily.   Yes Historical Provider, MD  Tamsulosin HCl (FLOMAX) 0.4 MG CAPS Take 0.4 mg by mouth daily.    Yes Historical Provider, MD  tiotropium (SPIRIVA) 18 MCG inhalation capsule Place 18 mcg into inhaler and inhale daily.     Yes Historical Provider, MD  traZODone (DESYREL) 100 MG tablet Take 1 tablet (100 mg total) by mouth at bedtime. 03/20/14  Yes Marianne L York, PA-C  Vitamin D, Ergocalciferol, (DRISDOL) 50000 UNITS CAPS capsule Take 50,000 Units by mouth every Thursday.   Yes Historical Provider, MD   BP 157/86  Pulse 76  Temp(Src) 98.1 F (36.7 C) (Oral)  Resp 16  SpO2 93% Physical Exam  Nursing note and vitals reviewed. Constitutional: He is oriented to person, place, and time. He appears well-developed and well-nourished. He appears distressed (he appears uncomfortable. He is continually moving his legs and tapping his chest with his right arm.).  HENT:  Head: Normocephalic and atraumatic.  Right Ear: External ear normal.  Left Ear: External ear normal.  Eyes: Conjunctivae and EOM are normal. Pupils are equal, round, and reactive to light.  Neck: Normal range of motion and phonation normal. Neck supple.  Cardiovascular: Normal rate, regular rhythm, normal heart sounds and intact distal pulses.   Pulmonary/Chest: Effort normal and breath sounds normal. He exhibits no bony tenderness.  Abdominal: Soft. There is no tenderness.  Musculoskeletal: Normal range of motion.  Neurological: He is alert and oriented to person, place, and time. No cranial nerve deficit or sensory deficit. He exhibits normal muscle tone. Coordination normal.  Skin: Skin is warm, dry and intact.  Psychiatric: His behavior is normal. Judgment and thought content normal.  Anxious    ED Course  Procedures  (including critical care time)  Medications  0.9 %  sodium chloride infusion ( Intravenous New Bag/Given 07/22/14 1813)    Patient Vitals for the past 24 hrs:  BP Temp Temp src Pulse Resp SpO2  07/22/14 1810 157/86 mmHg - - 76 - 93 %  07/22/14 1651 143/42 mmHg 98.1 F (36.7 C) Oral 69 16 99 %    5:30 PM Reevaluation with update and discussion. After initial assessment and treatment, an updated evaluation reveals he is calm at this time. There is no involuntary muscle movement. Findings discussed with the patient, all questions answered. Asuna Peth L    Labs Review Labs Reviewed  CBC WITH DIFFERENTIAL - Abnormal; Notable for the following:    RBC 3.13 (*)  Hemoglobin 10.0 (*)    HCT 29.4 (*)    Monocytes Relative 14 (*)    All other components within normal limits  BASIC METABOLIC PANEL - Abnormal; Notable for the following:    Glucose, Bld 51 (*)    BUN 35 (*)    Creatinine, Ser 2.27 (*)    GFR calc non Af Amer 26 (*)    GFR calc Af Amer 31 (*)    All other components within normal limits  URINALYSIS, ROUTINE W REFLEX MICROSCOPIC - Abnormal; Notable for the following:    APPearance CLOUDY (*)    Protein, ur 100 (*)    All other components within normal limits  URINE CULTURE  URINE MICROSCOPIC-ADD ON    Imaging Review No results found.   EKG Interpretation None      MDM   Final diagnoses:  Myalgia    Nonspecific myalgia with nonspecific leg movements. There is no evidence for spinal myelopathy, metabolic instability, or serious bacterial infection.  Nursing Notes Reviewed/ Care Coordinated Applicable Imaging Reviewed Interpretation of Laboratory Data incorporated into ED treatment  The patient appears reasonably screened and/or stabilized for discharge and I doubt any other medical condition or other [facility] requiring further screening, evaluation, or treatment in the ED at this time prior to discharge.  Plan: Home Medications- usual; Home Treatments- rest;  return here if the recommended treatment, does not improve the symptoms; Recommended follow up- PCP 1 week for check up    Flint Melter, MD 07/22/14 [phone]

## 2014-07-22 NOTE — ED Notes (Signed)
Pts iv removed. Pt awaiting discharge instructions and PTAR

## 2014-07-22 NOTE — Discharge Instructions (Signed)
There is no apparent cause for your achiness. Take your usual medications. Followup with your primary care doctor in one or 2 weeks for checkup.    Pain of Unknown Etiology (Pain Without a Known Cause) You have come to your caregiver because of pain. Pain can occur in any part of the body. Often there is not a definite cause. If your laboratory (blood or urine) work was normal and X-rays or other studies were normal, your caregiver may treat you without knowing the cause of the pain. An example of this is the headache. Most headaches are diagnosed by taking a history. This means your caregiver asks you questions about your headaches. Your caregiver determines a treatment based on your answers. Usually testing done for headaches is normal. Often testing is not done unless there is no response to medications. Regardless of where your pain is located today, you can be given medications to make you comfortable. If no physical cause of pain can be found, most cases of pain will gradually leave as suddenly as they came.  If you have a painful condition and no reason can be found for the pain, it is important that you follow up with your caregiver. If the pain becomes worse or does not go away, it may be necessary to repeat tests and look further for a possible cause.  Only take over-the-counter or prescription medicines for pain, discomfort, or fever as directed by your caregiver.  For the protection of your privacy, test results cannot be given over the phone. Make sure you receive the results of your test. Ask how these results are to be obtained if you have not been informed. It is your responsibility to obtain your test results.  You may continue all activities unless the activities cause more pain. When the pain lessens, it is important to gradually resume normal activities. Resume activities by beginning slowly and gradually increasing the intensity and duration of the activities or exercise. During  periods of severe pain, bed rest may be helpful. Lie or sit in any position that is comfortable.  Ice used for acute (sudden) conditions may be effective. Use a large plastic bag filled with ice and wrapped in a towel. This may provide pain relief.  See your caregiver for continued problems. Your caregiver can help or refer you for exercises or physical therapy if necessary. If you were given medications for your condition, do not drive, operate machinery or power tools, or sign legal documents for 24 hours. Do not drink alcohol, take sleeping pills, or take other medications that may interfere with treatment. See your caregiver immediately if you have pain that is becoming worse and not relieved by medications. Document Released: 08/24/2001 Document Revised: 09/19/2013 Document Reviewed: 11/29/2005 [facility] Patient Information 2015 [place]Care, [place]. This information is not intended to replace advice given to you by your health care provider. Make sure you discuss any questions you have with your health care provider.

## 2014-07-22 NOTE — ED Notes (Signed)
Per EMS, [facility] [place], with compliant from daughter not "feeling well." Pt A&Ox4, NAD noted. Pt denies chest pain or LOC. Initial CBG 60, crackers given in route. Pt has chronic back pain, HTN, DM. VSS: BP 139/59, P72, R20, 97% rm air, CBG 85.

## 2014-07-23 LAB — URINE CULTURE
Colony Count: NO GROWTH
Culture: NO GROWTH

## 2014-09-05 ENCOUNTER — Ambulatory Visit (INDEPENDENT_AMBULATORY_CARE_PROVIDER_SITE_OTHER): Payer: Medicare Other | Admitting: Psychology

## 2014-09-05 DIAGNOSIS — F332 Major depressive disorder, recurrent severe without psychotic features: Secondary | ICD-10-CM

## 2014-09-13 ENCOUNTER — Ambulatory Visit (HOSPITAL_COMMUNITY): Payer: Self-pay | Admitting: Psychiatry

## 2014-09-24 ENCOUNTER — Ambulatory Visit (INDEPENDENT_AMBULATORY_CARE_PROVIDER_SITE_OTHER): Payer: Medicare Other | Admitting: Psychology

## 2014-09-24 DIAGNOSIS — F332 Major depressive disorder, recurrent severe without psychotic features: Secondary | ICD-10-CM

## 2014-10-01 ENCOUNTER — Encounter (HOSPITAL_COMMUNITY): Payer: Self-pay | Admitting: Emergency Medicine

## 2014-10-01 ENCOUNTER — Emergency Department (HOSPITAL_COMMUNITY)
Admission: EM | Admit: 2014-10-01 | Discharge: 2014-10-02 | Disposition: A | Discharge status: Home / Self Care | Payer: Medicare Other | Attending: Emergency Medicine | Admitting: Emergency Medicine

## 2014-10-01 ENCOUNTER — Emergency Department (HOSPITAL_COMMUNITY): Payer: Medicare Other

## 2014-10-01 DIAGNOSIS — S161XXA Strain of muscle, fascia and tendon at neck level, initial encounter: Secondary | ICD-10-CM | POA: Insufficient documentation

## 2014-10-01 DIAGNOSIS — Z794 Long term (current) use of insulin: Secondary | ICD-10-CM | POA: Diagnosis not present

## 2014-10-01 DIAGNOSIS — S199XXA Unspecified injury of neck, initial encounter: Secondary | ICD-10-CM | POA: Diagnosis present

## 2014-10-01 DIAGNOSIS — Y9389 Activity, other specified: Secondary | ICD-10-CM | POA: Diagnosis not present

## 2014-10-01 DIAGNOSIS — N189 Chronic kidney disease, unspecified: Secondary | ICD-10-CM | POA: Insufficient documentation

## 2014-10-01 DIAGNOSIS — Z7982 Long term (current) use of aspirin: Secondary | ICD-10-CM | POA: Insufficient documentation

## 2014-10-01 DIAGNOSIS — I129 Hypertensive chronic kidney disease with stage 1 through stage 4 chronic kidney disease, or unspecified chronic kidney disease: Secondary | ICD-10-CM | POA: Insufficient documentation

## 2014-10-01 DIAGNOSIS — S0990XA Unspecified injury of head, initial encounter: Secondary | ICD-10-CM | POA: Diagnosis not present

## 2014-10-01 DIAGNOSIS — Y929 Unspecified place or not applicable: Secondary | ICD-10-CM | POA: Diagnosis not present

## 2014-10-01 DIAGNOSIS — I5032 Chronic diastolic (congestive) heart failure: Secondary | ICD-10-CM | POA: Insufficient documentation

## 2014-10-01 DIAGNOSIS — J449 Chronic obstructive pulmonary disease, unspecified: Secondary | ICD-10-CM | POA: Insufficient documentation

## 2014-10-01 DIAGNOSIS — Z87891 Personal history of nicotine dependence: Secondary | ICD-10-CM | POA: Insufficient documentation

## 2014-10-01 DIAGNOSIS — E785 Hyperlipidemia, unspecified: Secondary | ICD-10-CM | POA: Diagnosis not present

## 2014-10-01 DIAGNOSIS — W01198A Fall on same level from slipping, tripping and stumbling with subsequent striking against other object, initial encounter: Secondary | ICD-10-CM | POA: Insufficient documentation

## 2014-10-01 DIAGNOSIS — Z79899 Other long term (current) drug therapy: Secondary | ICD-10-CM | POA: Diagnosis not present

## 2014-10-01 DIAGNOSIS — E119 Type 2 diabetes mellitus without complications: Secondary | ICD-10-CM | POA: Insufficient documentation

## 2014-10-01 DIAGNOSIS — W19XXXA Unspecified fall, initial encounter: Secondary | ICD-10-CM

## 2014-10-01 MED ORDER — ACETAMINOPHEN 325 MG PO TABS
650.0000 mg | ORAL_TABLET | Freq: Once | ORAL | Status: AC
  Administered 2014-10-01: 650 mg via ORAL
  Filled 2014-10-01: qty 2

## 2014-10-01 NOTE — ED Notes (Signed)
Pt states he used oxygen at home at night. I placed pt on 2lpm

## 2014-10-01 NOTE — ED Provider Notes (Signed)
CSN: [id number]     Arrival date & time 10/01/14  2054 History   First MD Initiated Contact with Patient 10/01/14 2158     Chief Complaint  Patient presents with  . Fall     (Consider location/radiation/quality/duration/timing/severity/associated sxs/prior Treatment) Patient is a 78 y.o. male presenting with fall. The history is provided by the patient.  Fall This is a new problem. Associated symptoms include headaches. Pertinent negatives include no chest pain, no abdominal pain and no shortness of breath.   patient states that he was in a garage to drive a friend to the hospital when he tripped and fell, striking his head. He states he was dazed but did not lose consciousness. He states he has some aches and pains all over he did hit his head and has pain there and his neck. He lives alone. No numbness or weakness. No current effusion. No nausea vomiting. He is on aspirin for anticoagulation  Past Medical History  Diagnosis Date  . Hypertension   . OSA (obstructive sleep apnea)   . Hyperlipidemia   . Type 2 diabetes mellitus   . COPD (chronic obstructive pulmonary disease)     with exacerbation in the past  . Chronic diastolic heart failure   . Chronic kidney disease     with proteinuria   Past Surgical History  Procedure Laterality Date  . Back surgery    . Kidney stone surgery    . Appendectomy    . Hernia repair    . Cardiac catheterization  10/05/2011     normal filling pressures with a wedge of 10 and a PAS of  42.  In the coronary arteriogram, the LAD had a 20% proximal stenosisand the RCA had a 30% mid stenosis with no other disease noted     Family History  Problem Relation Age of Onset  . Bronchitis Mother   . Colon cancer Father    History  Substance Use Topics  . Smoking status: Former Smoker -- 2.00 packs/day for 65 years    Types: Cigarettes    Quit date: 07/14/2011  . Smokeless tobacco: Never Used  . Alcohol Use: No    Review of Systems   Constitutional: Negative for fever and activity change.  Respiratory: Negative for shortness of breath.   Cardiovascular: Negative for chest pain.  Gastrointestinal: Negative for nausea, vomiting, abdominal pain and diarrhea.  Genitourinary: Negative for flank pain.  Musculoskeletal: Positive for neck pain. Negative for back pain and neck stiffness.  Skin: Negative for rash and wound.  Neurological: Positive for headaches. Negative for weakness and numbness.  Psychiatric/Behavioral: Negative for behavioral problems.      Allergies  Ambien; Bupropion hcl; Cymbalta; Lisinopril; Metoprolol; Naproxen; and Norvasc  Home Medications   Prior to Admission medications   Medication Sig Start Date End Date Taking? Authorizing Provider  allopurinol (ZYLOPRIM) 100 MG tablet Take 100 mg by mouth every morning.   Yes Historical Provider, MD  aspirin EC 81 MG tablet Take 81 mg by mouth daily.     Yes Historical Provider, MD  Cholecalciferol (VITAMIN D PO) Take 1 tablet by mouth daily.   Yes Historical Provider, MD  Fluticasone-Salmeterol (ADVAIR DISKUS) 250-50 MCG/DOSE AEPB Inhale 1 puff into the lungs every 12 (twelve) hours.     Yes Historical Provider, MD  HYDROcodone-acetaminophen (NORCO) 10-325 MG per tablet Take 1 tablet by mouth 2 (two) times daily as needed. For pain 09/16/14  Yes Historical Provider, MD  insulin NPH Human (HUMULIN N,NOVOLIN  N) 100 UNIT/ML injection Inject 0.55 mLs (55 Units total) into the skin 2 (two) times daily before a meal. 03/20/14  Yes Tora Kindred York, PA-C  insulin regular (HUMULIN R,NOVOLIN R) 100 units/mL injection Inject 20 Units into the skin 3 (three) times daily before meals.    Yes Historical Provider, MD  Tamsulosin HCl (FLOMAX) 0.4 MG CAPS Take 0.4 mg by mouth daily.    Yes Historical Provider, MD  tiotropium (SPIRIVA) 18 MCG inhalation capsule Place 18 mcg into inhaler and inhale daily.     Yes Historical Provider, MD  traZODone (DESYREL) 100 MG tablet Take 1  tablet (100 mg total) by mouth at bedtime. 03/20/14  Yes Marianne L York, PA-C  amLODipine (NORVASC) 10 MG tablet Take 10 mg by mouth daily.    Historical Provider, MD  atorvastatin (LIPITOR) 20 MG tablet Take 20 mg by mouth at bedtime. 02/22/14   Historical Provider, MD  calcitRIOL (ROCALTROL) 0.25 MCG capsule Take 0.25 mcg by mouth daily.    Historical Provider, MD  citalopram (CELEXA) 20 MG tablet Take 60 mg by mouth daily. 03/20/14   Stephani Police, PA-C  ferrous sulfate 325 (65 FE) MG tablet Take 1 tablet (325 mg total) by mouth 2 (two) times daily with a meal. 03/20/14   Tora Kindred York, PA-C  furosemide (LASIX) 40 MG tablet Take 40-80 mg by mouth 2 (two) times daily. Take 2 tablets (80 mg) in the morning and 1 tablet (40 mg) in the evening.  May take an extra dose in the evening for increased swelling.    Historical Provider, MD  hydrALAZINE (APRESOLINE) 25 MG tablet Take 25 mg by mouth 3 (three) times daily.     Historical Provider, MD  metolazone (ZAROXOLYN) 5 MG tablet Take 5 mg by mouth daily.    Historical Provider, MD  omega-3 acid ethyl esters (LOVAZA) 1 G capsule Take 1 g by mouth 2 (two) times daily.    Historical Provider, MD  potassium chloride SA (K-DUR,KLOR-CON) 20 MEQ tablet Take 20 mEq by mouth 2 (two) times daily.    Historical Provider, MD  propranolol (INDERAL) 10 MG tablet Take 10 mg by mouth 2 (two) times daily.    Historical Provider, MD   BP 127/100  Pulse 85  Temp(Src) 98.2 F (36.8 C) (Oral)  Ht 6' (1.829 m)  Wt 230 lb (104.327 kg)  BMI 31.19 kg/m2  SpO2 97% Physical Exam  Nursing note and vitals reviewed. Constitutional: He is oriented to person, place, and time. He appears well-developed and well-nourished.  HENT:  Head: Normocephalic and atraumatic.  Eyes: EOM are normal. Pupils are equal, round, and reactive to light.  Neck: Normal range of motion. Neck supple.  Cardiovascular: Normal rate, regular rhythm and normal heart sounds.   No murmur  heard. Pulmonary/Chest: Effort normal and breath sounds normal.  Abdominal: Soft. Bowel sounds are normal. He exhibits no distension and no mass. There is no tenderness. There is no rebound and no guarding.  Musculoskeletal: Normal range of motion. He exhibits no edema.  Mild lateral cervical spine tenderness the right. Range of motion intact. Neurovascular intact over bilateral upper and lower extremities. No peripheral tenderness.  Neurological: He is alert and oriented to person, place, and time. No cranial nerve deficit.  Skin: Skin is warm and dry.  Psychiatric: He has a normal mood and affect.    ED Course  Procedures (including critical care time) Labs Review Labs Reviewed - No data to display  Imaging  Review Ct Head Wo Contrast  10/01/2014   CLINICAL DATA:  Initial evaluation for acute traumatic injury. Fall, hit right side of head, face, jaw.  EXAM: CT HEAD WITHOUT CONTRAST  CT CERVICAL SPINE WITHOUT CONTRAST  TECHNIQUE: Multidetector CT imaging of the head and cervical spine was performed following the standard protocol without intravenous contrast. Multiplanar CT image reconstructions of the cervical spine were also generated.  COMPARISON:  Prior study from 03/21/2014  FINDINGS: CT HEAD FINDINGS  There is no acute intracranial hemorrhage or infarct. No mass lesion or midline shift. Gray-white matter differentiation is well maintained. Ventricles are normal in size without evidence of hydrocephalus. CSF containing spaces are within normal limits. No extra-axial fluid collection.  The calvarium is intact.  Orbital soft tissues are within normal limits.  Paranasal sinuses are well pneumatized and free of fluid. Chronic left mastoiditis again noted.  Scalp soft tissues are unremarkable.  CT CERVICAL SPINE FINDINGS  There is trace anterolisthesis of C4 on C5, chronic in nature. Otherwise, vertebral bodies are normally aligned with preservation of the normal cervical lordosis. Vertebral body  heights are preserved. Normal C1-2 articulations are intact. No prevertebral soft tissue swelling. No acute fracture or listhesis.  Severe degenerative disc disease is evidenced by intervertebral disc space narrowing, endplate sclerosis, and osteophytosis is seen at C5-6 and C6-7.  Visualized soft tissues of the neck are within normal limits. Visualized lung apices are clear without evidence of apical pneumothorax.  IMPRESSION: CT BRAIN:  No acute intracranial process.  CT CERVICAL SPINE:  1. No acute traumatic injury within the cervical spine. 2. Stable trace anterolisthesis of C4 on C5 with multilevel degenerative disc disease as above.   Electronically Signed   By: Rise MuBenjamin  McClintock M.D.   On: 10/01/2014 23:18   Ct Cervical Spine Wo Contrast  10/01/2014   CLINICAL DATA:  Initial evaluation for acute traumatic injury. Fall, hit right side of head, face, jaw.  EXAM: CT HEAD WITHOUT CONTRAST  CT CERVICAL SPINE WITHOUT CONTRAST  TECHNIQUE: Multidetector CT imaging of the head and cervical spine was performed following the standard protocol without intravenous contrast. Multiplanar CT image reconstructions of the cervical spine were also generated.  COMPARISON:  Prior study from 03/21/2014  FINDINGS: CT HEAD FINDINGS  There is no acute intracranial hemorrhage or infarct. No mass lesion or midline shift. Gray-white matter differentiation is well maintained. Ventricles are normal in size without evidence of hydrocephalus. CSF containing spaces are within normal limits. No extra-axial fluid collection.  The calvarium is intact.  Orbital soft tissues are within normal limits.  Paranasal sinuses are well pneumatized and free of fluid. Chronic left mastoiditis again noted.  Scalp soft tissues are unremarkable.  CT CERVICAL SPINE FINDINGS  There is trace anterolisthesis of C4 on C5, chronic in nature. Otherwise, vertebral bodies are normally aligned with preservation of the normal cervical lordosis. Vertebral body  heights are preserved. Normal C1-2 articulations are intact. No prevertebral soft tissue swelling. No acute fracture or listhesis.  Severe degenerative disc disease is evidenced by intervertebral disc space narrowing, endplate sclerosis, and osteophytosis is seen at C5-6 and C6-7.  Visualized soft tissues of the neck are within normal limits. Visualized lung apices are clear without evidence of apical pneumothorax.  IMPRESSION: CT BRAIN:  No acute intracranial process.  CT CERVICAL SPINE:  1. No acute traumatic injury within the cervical spine. 2. Stable trace anterolisthesis of C4 on C5 with multilevel degenerative disc disease as above.   Electronically Signed  By: Rise Mu M.D.   On: 10/01/2014 23:18     EKG Interpretation None      MDM   Final diagnoses:  Fall, initial encounter  Cervical strain, acute, initial encounter    Patient with mechanical fall. Hit head and has had neck pain. CT scan done. No other injury apparent.    Juliet Rude. Rubin Payor, MD 10/01/14 2333

## 2014-10-01 NOTE — Discharge Instructions (Signed)
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
## 2014-10-22 ENCOUNTER — Ambulatory Visit (HOSPITAL_COMMUNITY): Payer: Self-pay | Admitting: Psychology

## 2014-11-21 ENCOUNTER — Encounter (HOSPITAL_COMMUNITY): Payer: Self-pay | Admitting: Psychology
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
## 2015-05-25 IMAGING — CT CT CHEST W/O CM
2 of 3 series · 15 of 36 positions shown, 18 images · non-contrast
Comparison: DG CHEST 2 VIEW dated 03/16/2014

CLINICAL DATA: Hemoptysis.

EXAM:
CT CHEST WITHOUT CONTRAST
TECHNIQUE: Multidetector CT imaging of the chest was performed following the
standard protocol without IV contrast.

[Series 2: thorax 5.0 i31f 1 · axial · 0.77mm/px · z∈[-862,-582]mm · 12 of 66 slices shown, 15 images]
[im 5/66  mediastinal]
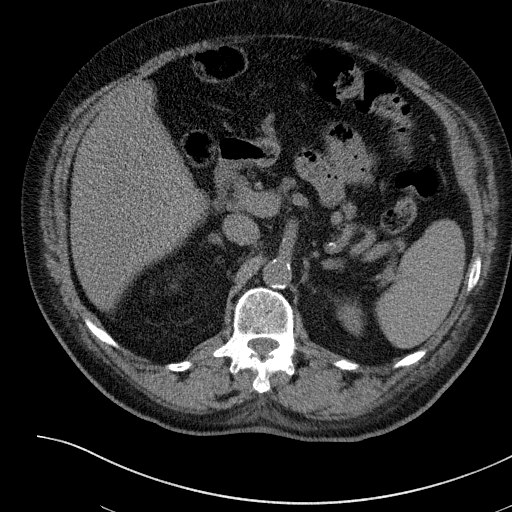
[im 5/66  lung]
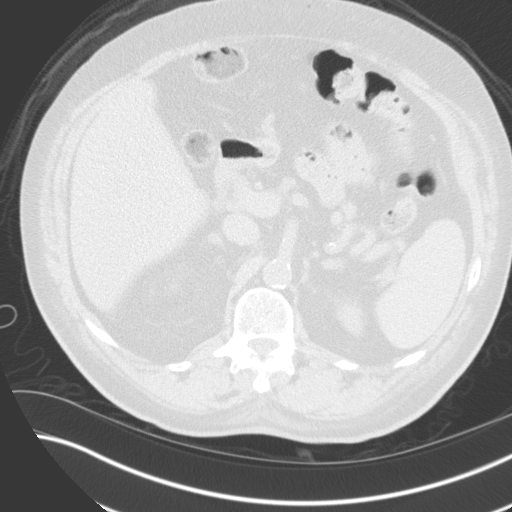
[im 10/66  lung]
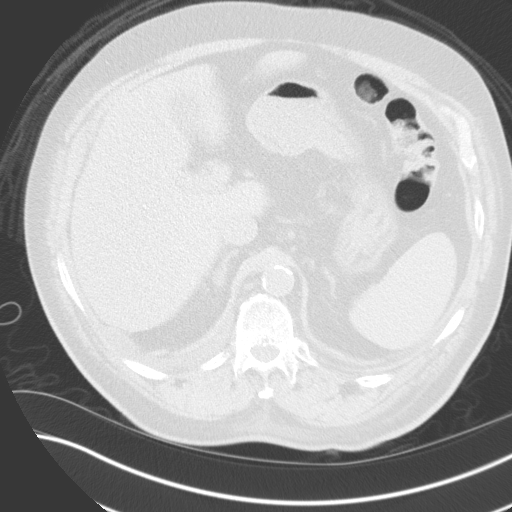
[im 15/66  lung]
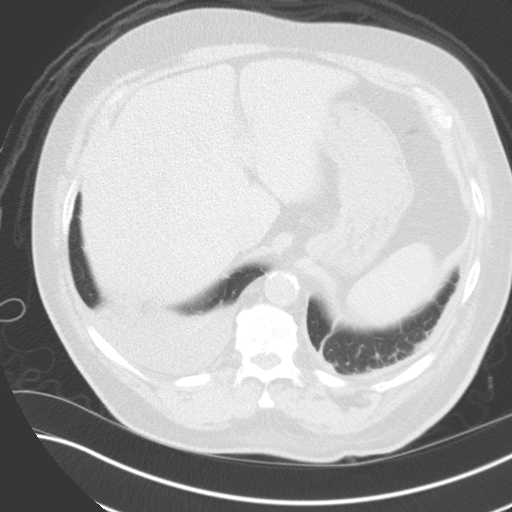
[im 20/66  lung]
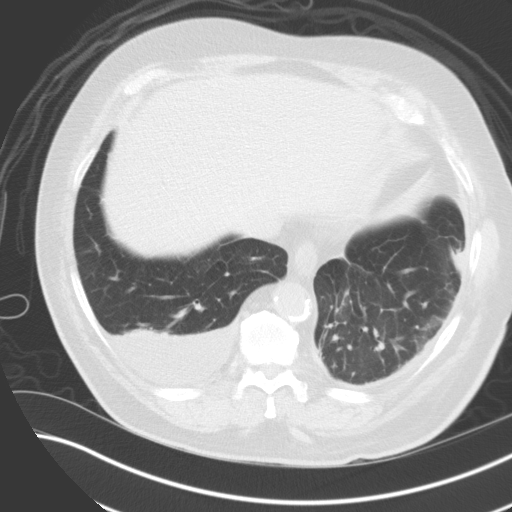
[im 25/66  mediastinal]
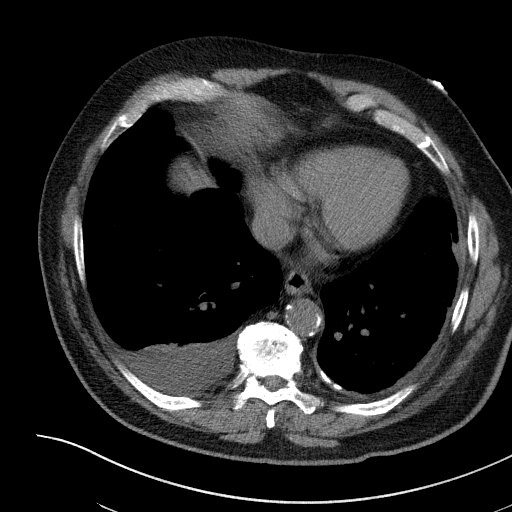
[im 25/66  lung]
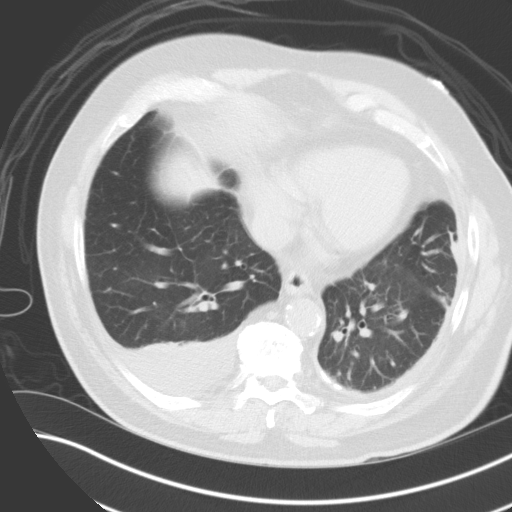
[im 29/66  lung]
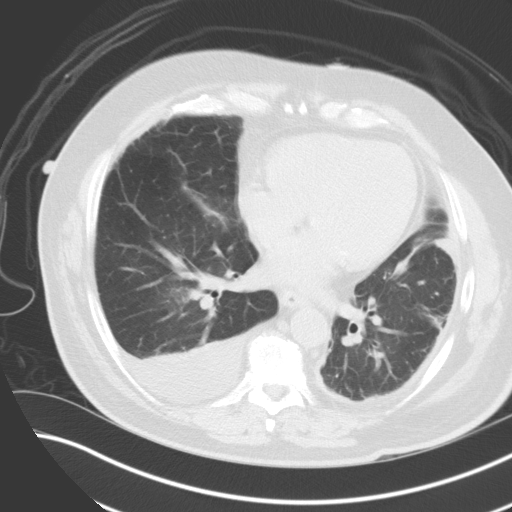
[im 37/66  lung]
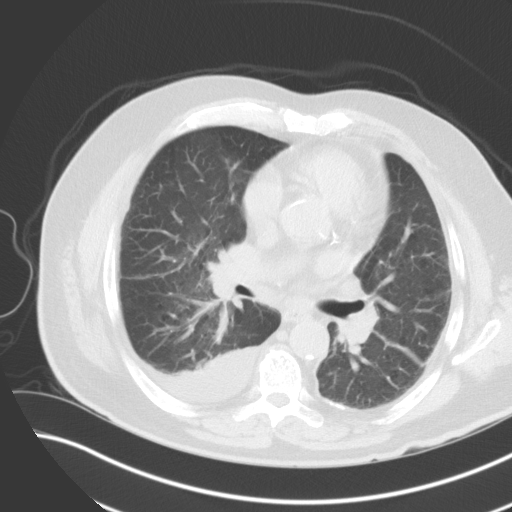
[im 41/66  lung]
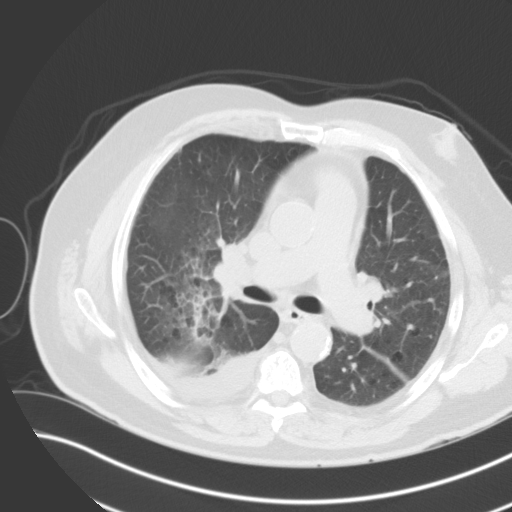
[im 46/66  mediastinal]
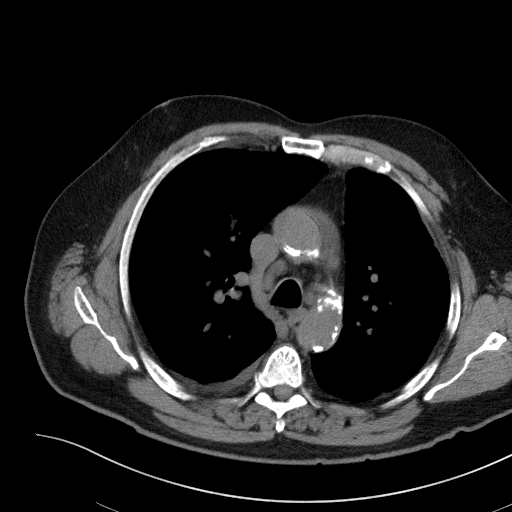
[im 46/66  lung]
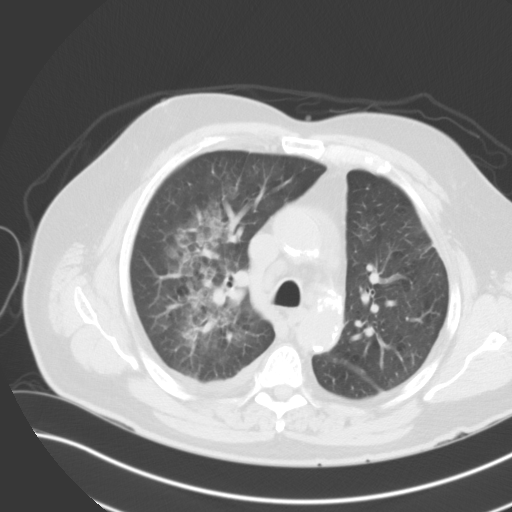
[im 51/66  lung]
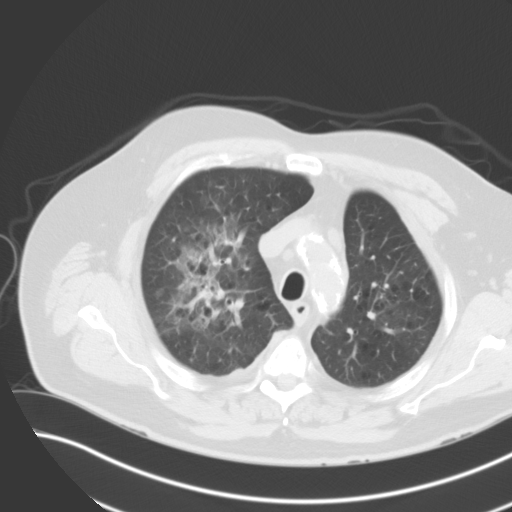
[im 56/66  lung]
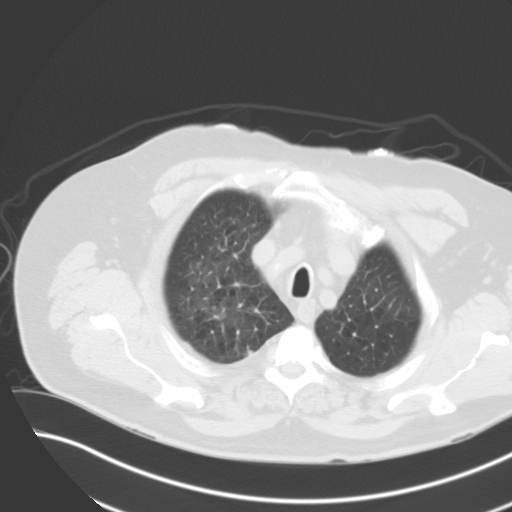
[im 61/66  lung]
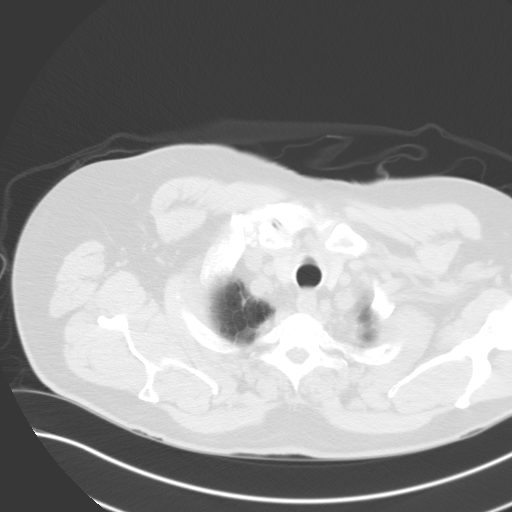

[Series 5: coronal · coronal · 0.64mm/px · 3 of 104 slices shown]
[im 21/104  lung]
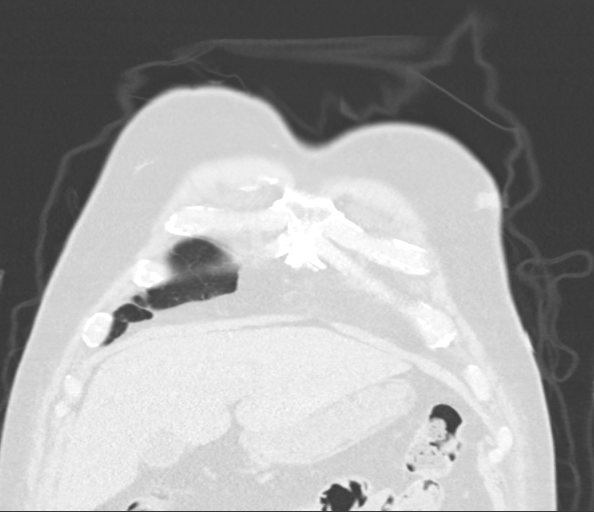
[im 42/104  lung]
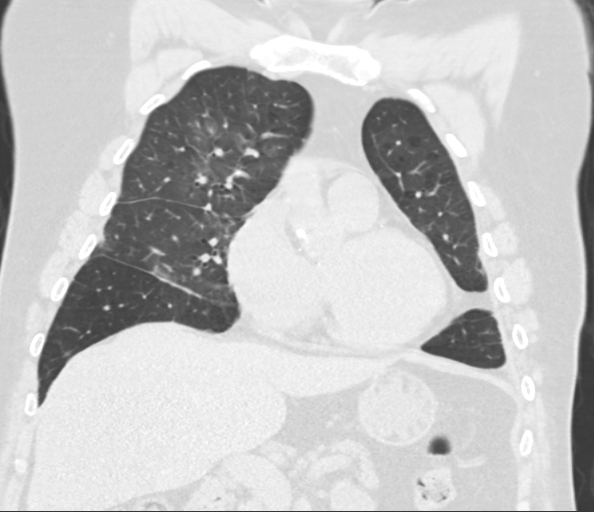
[im 62/104  lung]
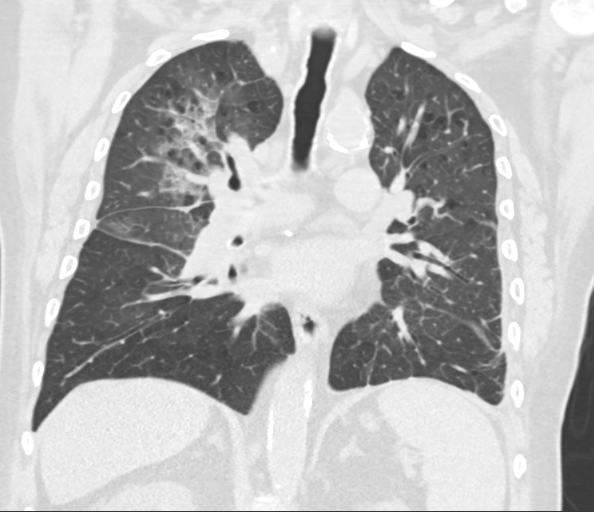

[15 of 36 positions shown; findings below may reference images not displayed]

FINDINGS: Atherosclerotic vascular calcification noted of the thoracic aorta.
Aorta normal caliber. Heart size normal. Coronary artery disease.

Multiple mediastinal and paratracheal lymph nodes noted. Largest
lymph node is retrocaval pretracheal, image number 22. This measures
1.4 cm. Thoracic esophagus unremarkable.

Large airways are patent. Prominent patchy pulmonary infiltrates
noted throughout the right lung most consistent with pneumonia.
Associated right-sided pleural effusion is present. Small left
pleural effusion cannot be excluded. Mild basilar atelectasis noted
on the left. No pneumothorax.

Adrenals are normal.  Gallstone noted.

Degenerative changes thoracic spine. No acute bony abnormality
identified. Asymmetric soft tissue density left breast. Mammography
is suggested to exclude a breast mass.
IMPRESSION: 1. Prominent pulmonary infiltrates noted throughout the right lung
with moderate size right pleural effusion.
2. Mild left basilar atelectasis and small pleural effusion on the
left.
3. Asymmetric soft tissue density of the left breast. Although this
patient is a male, mammography is suggested as a left breast
malignancy cannot be excluded.
4. Coronary artery disease.
5. Multiple small mediastinal lymph nodes go largest measures 1.4 cm
in the retrocaval pretracheal region. These are nonspecific.

## 2015-05-25 IMAGING — CR DG CHEST 2V
2 series · 2 of 2 positions shown · non-contrast
Comparison: 10/31/2008

CLINICAL DATA: Cough.  Hemoptysis.

EXAM:
CHEST  2 VIEW

[w chest pa]
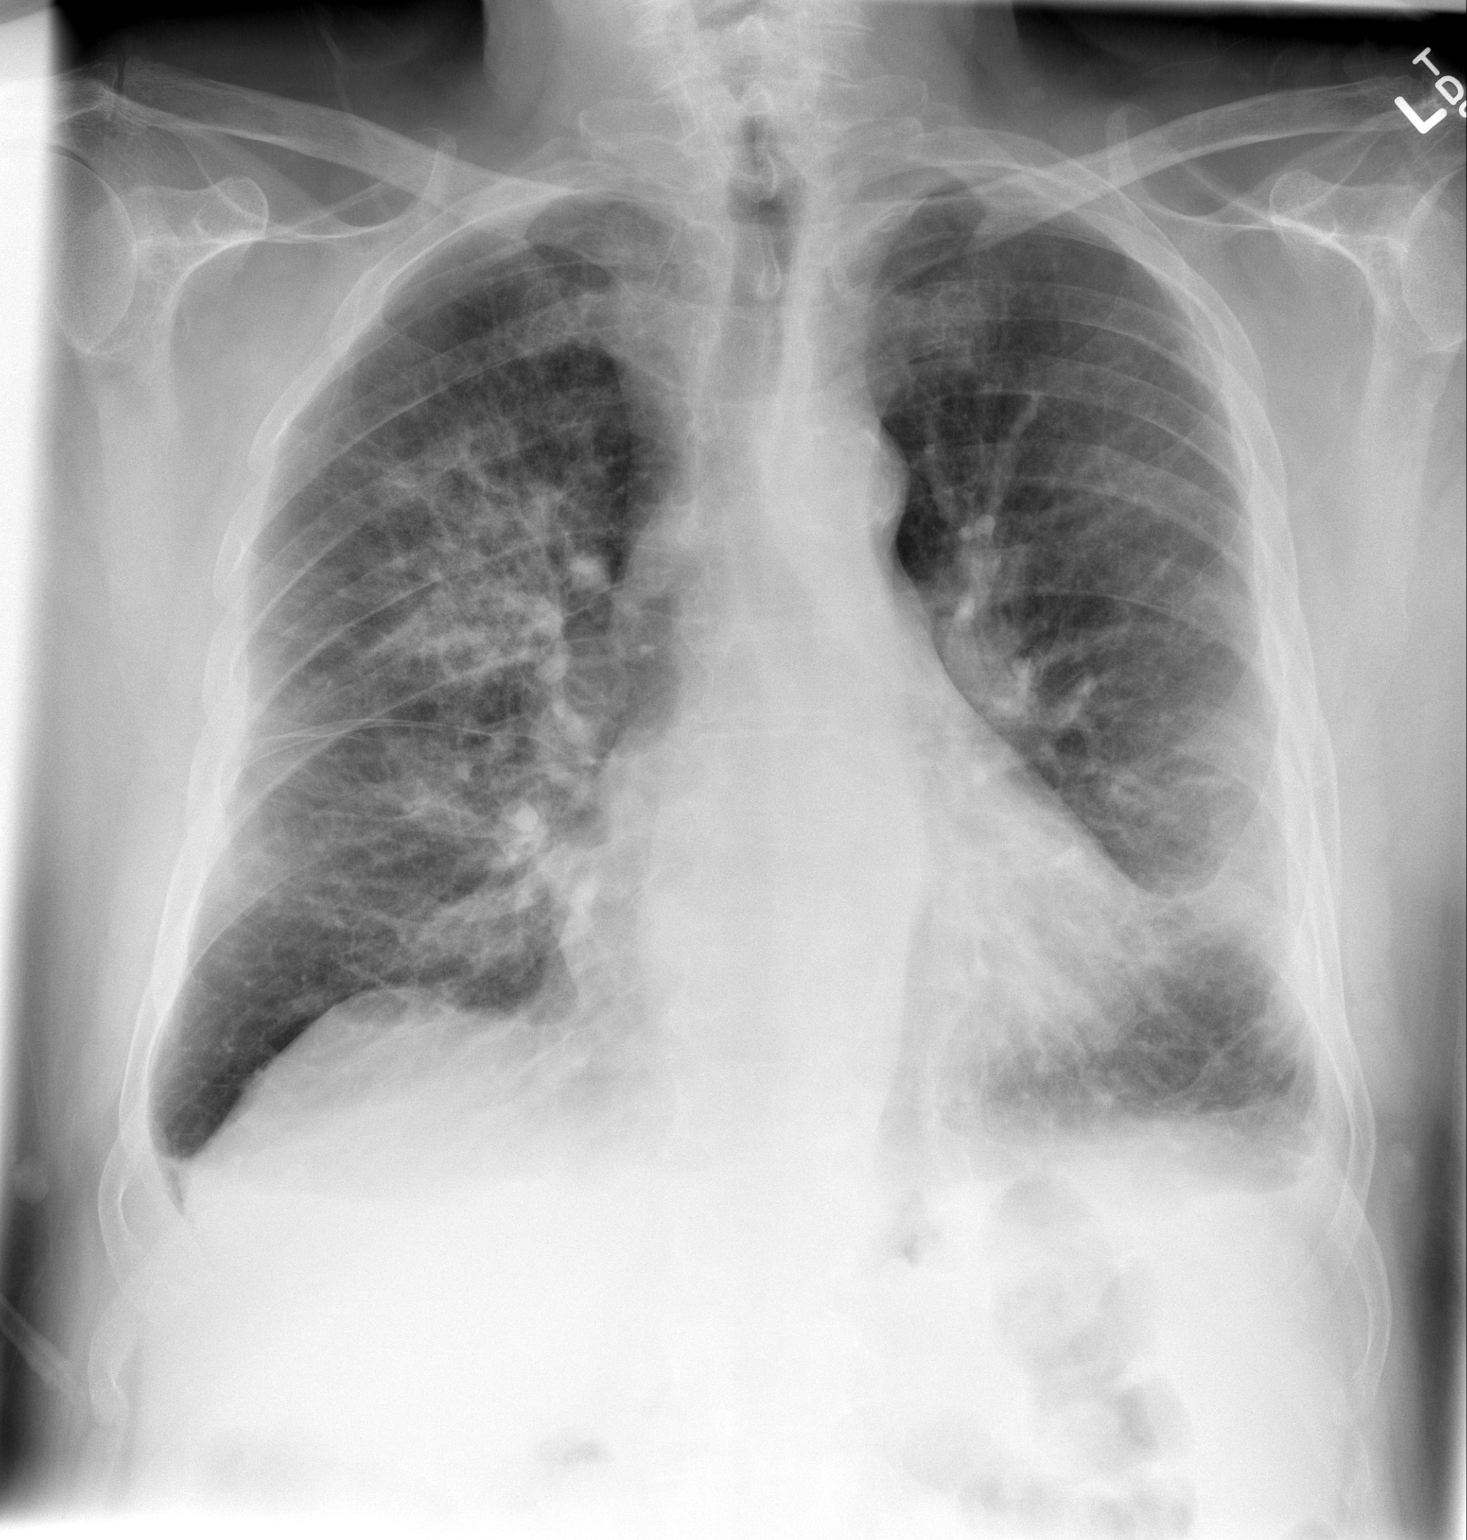

[w chest lat]
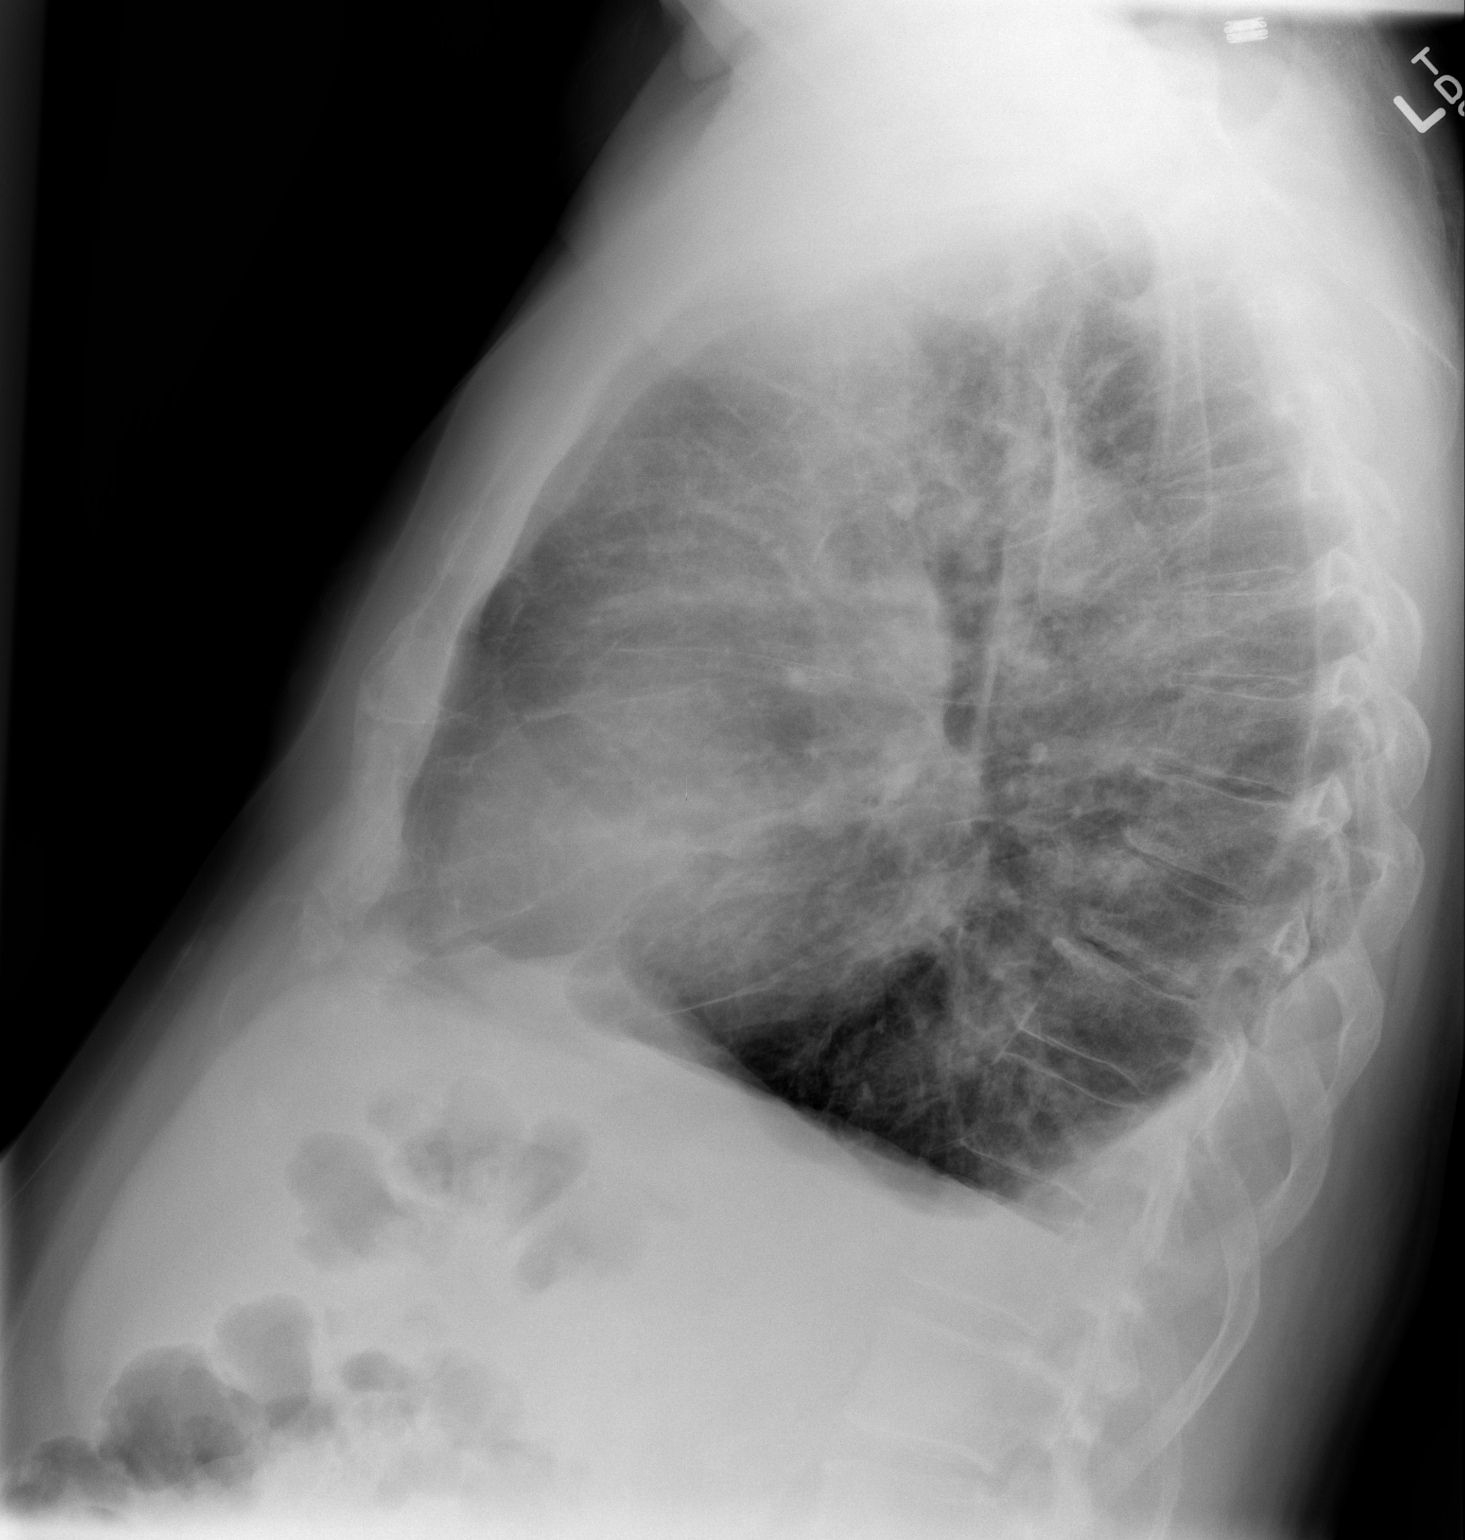

[2 of 2 positions shown; findings below may reference images not displayed]

FINDINGS: The cardiac silhouette, mediastinal and hilar contours are within
normal limits and stable. There is tortuosity and calcification of
the thoracic aorta. There are suspected underlying emphysematous
changes with superimposed bilateral infiltrates. Small bilateral
pleural effusions are noted. The bony thorax is intact.
IMPRESSION: Suspect underlying emphysematous changes.

Bilateral lung infiltrates.

Small bilateral pleural effusions.

## 2015-10-14 DEATH — deceased
